# Patient Record
Sex: Male | Born: 1965 | Race: Black or African American | Hispanic: No | Marital: Single | Smoking: Current every day smoker
Health system: Southern US, Community
[De-identification: ages and names within clinical notes are randomized; demographics above are authoritative.]

## PROBLEM LIST (undated history)

## (undated) DIAGNOSIS — E785 Hyperlipidemia, unspecified: Secondary | ICD-10-CM

## (undated) DIAGNOSIS — G43909 Migraine, unspecified, not intractable, without status migrainosus: Secondary | ICD-10-CM

## (undated) DIAGNOSIS — K219 Gastro-esophageal reflux disease without esophagitis: Secondary | ICD-10-CM

## (undated) DIAGNOSIS — F1021 Alcohol dependence, in remission: Secondary | ICD-10-CM

## (undated) DIAGNOSIS — I10 Essential (primary) hypertension: Secondary | ICD-10-CM

## (undated) DIAGNOSIS — R0609 Other forms of dyspnea: Secondary | ICD-10-CM

## (undated) DIAGNOSIS — R0600 Dyspnea, unspecified: Secondary | ICD-10-CM

## (undated) DIAGNOSIS — J449 Chronic obstructive pulmonary disease, unspecified: Secondary | ICD-10-CM

## (undated) DIAGNOSIS — Z8619 Personal history of other infectious and parasitic diseases: Secondary | ICD-10-CM

## (undated) DIAGNOSIS — Z972 Presence of dental prosthetic device (complete) (partial): Secondary | ICD-10-CM

## (undated) DIAGNOSIS — Z87820 Personal history of traumatic brain injury: Secondary | ICD-10-CM

## (undated) DIAGNOSIS — J410 Simple chronic bronchitis: Secondary | ICD-10-CM

## (undated) DIAGNOSIS — N478 Other disorders of prepuce: Secondary | ICD-10-CM

## (undated) HISTORY — DX: Hyperlipidemia, unspecified: E78.5

## (undated) HISTORY — DX: Migraine, unspecified, not intractable, without status migrainosus: G43.909

## (undated) HISTORY — DX: Gastro-esophageal reflux disease without esophagitis: K21.9

---

## 1998-03-05 ENCOUNTER — Emergency Department (HOSPITAL_COMMUNITY): Admission: EM | Admit: 1998-03-05 | Discharge: 1998-03-05 | Payer: Self-pay | Admitting: Emergency Medicine

## 2006-05-19 ENCOUNTER — Ambulatory Visit: Payer: Self-pay | Admitting: Nurse Practitioner

## 2007-01-20 ENCOUNTER — Ambulatory Visit: Payer: Self-pay | Admitting: Internal Medicine

## 2007-09-21 ENCOUNTER — Ambulatory Visit: Payer: Self-pay | Admitting: Family Medicine

## 2007-09-21 ENCOUNTER — Ambulatory Visit: Payer: Self-pay | Admitting: *Deleted

## 2007-10-05 ENCOUNTER — Ambulatory Visit: Payer: Self-pay | Admitting: Internal Medicine

## 2008-02-29 ENCOUNTER — Ambulatory Visit: Payer: Self-pay | Admitting: Internal Medicine

## 2008-02-29 LAB — CONVERTED CEMR LAB
ALT: 36 units/L (ref 0–53)
AST: 22 units/L (ref 0–37)
Albumin: 4.3 g/dL (ref 3.5–5.2)
Alkaline Phosphatase: 59 units/L (ref 39–117)
BUN: 9 mg/dL (ref 6–23)
CO2: 23 meq/L (ref 19–32)
Calcium: 9.4 mg/dL (ref 8.4–10.5)
Chloride: 101 meq/L (ref 96–112)
Cholesterol: 173 mg/dL (ref 0–200)
Creatinine, Ser: 0.93 mg/dL (ref 0.40–1.50)
Glucose, Bld: 65 mg/dL — ABNORMAL LOW (ref 70–99)
HDL: 50 mg/dL (ref >39)
LDL Cholesterol: 102 mg/dL — ABNORMAL HIGH (ref 0–99)
Potassium: 4.2 meq/L (ref 3.5–5.3)
Sodium: 142 meq/L (ref 135–145)
Total Bilirubin: 0.3 mg/dL (ref 0.3–1.2)
Total CHOL/HDL Ratio: 3.5
Total Protein: 7.5 g/dL (ref 6.0–8.3)
Triglycerides: 107 mg/dL (ref <150)
VLDL: 21 mg/dL (ref 0–40)

## 2008-04-05 ENCOUNTER — Ambulatory Visit: Payer: Self-pay | Admitting: Internal Medicine

## 2008-04-06 ENCOUNTER — Ambulatory Visit (HOSPITAL_COMMUNITY): Admission: RE | Admit: 2008-04-06 | Discharge: 2008-04-06 | Payer: Self-pay | Admitting: Family Medicine

## 2008-08-10 ENCOUNTER — Encounter (INDEPENDENT_AMBULATORY_CARE_PROVIDER_SITE_OTHER): Payer: Self-pay | Admitting: Internal Medicine

## 2008-08-10 ENCOUNTER — Ambulatory Visit: Payer: Self-pay | Admitting: Internal Medicine

## 2008-08-10 LAB — CONVERTED CEMR LAB
ALT: 23 units/L (ref 0–53)
AST: 16 units/L (ref 0–37)
Albumin: 4.5 g/dL (ref 3.5–5.2)
Alkaline Phosphatase: 67 units/L (ref 39–117)
Bilirubin, Direct: 0.1 mg/dL (ref 0.0–0.3)
HCV Ab: REACTIVE — AB
Hep A Total Ab: NEGATIVE
Hep B Core Total Ab: POSITIVE — AB
Hep B E Ab: POSITIVE — AB
Hep B S Ab: NEGATIVE
Indirect Bilirubin: 0.2 mg/dL (ref 0.0–0.9)
Total Bilirubin: 0.3 mg/dL (ref 0.3–1.2)
Total Protein: 7.5 g/dL (ref 6.0–8.3)

## 2008-08-31 ENCOUNTER — Ambulatory Visit: Payer: Self-pay | Admitting: Internal Medicine

## 2009-01-03 ENCOUNTER — Ambulatory Visit: Payer: Self-pay | Admitting: Internal Medicine

## 2009-01-31 ENCOUNTER — Ambulatory Visit: Payer: Self-pay | Admitting: Gastroenterology

## 2009-02-18 ENCOUNTER — Ambulatory Visit: Payer: Self-pay | Admitting: Internal Medicine

## 2009-02-21 ENCOUNTER — Ambulatory Visit (HOSPITAL_COMMUNITY): Admission: RE | Admit: 2009-02-21 | Discharge: 2009-02-21 | Payer: Self-pay | Admitting: Gastroenterology

## 2009-02-21 ENCOUNTER — Encounter (INDEPENDENT_AMBULATORY_CARE_PROVIDER_SITE_OTHER): Payer: Self-pay | Admitting: Interventional Radiology

## 2009-03-28 ENCOUNTER — Ambulatory Visit: Payer: Self-pay | Admitting: Gastroenterology

## 2009-11-20 ENCOUNTER — Ambulatory Visit: Payer: Self-pay | Admitting: Internal Medicine

## 2009-11-20 LAB — CONVERTED CEMR LAB
Chlamydia, Swab/Urine, PCR: NEGATIVE
GC Probe Amp, Urine: NEGATIVE

## 2010-03-27 ENCOUNTER — Ambulatory Visit: Payer: Self-pay | Admitting: Internal Medicine

## 2010-04-24 ENCOUNTER — Ambulatory Visit: Payer: Self-pay | Admitting: Gastroenterology

## 2010-08-31 ENCOUNTER — Emergency Department (HOSPITAL_COMMUNITY)
Admission: EM | Admit: 2010-08-31 | Discharge: 2010-08-31 | Payer: Self-pay | Source: Home / Self Care | Admitting: Emergency Medicine

## 2010-10-10 ENCOUNTER — Emergency Department (HOSPITAL_COMMUNITY)
Admission: EM | Admit: 2010-10-10 | Discharge: 2010-10-10 | Disposition: A | Discharge status: Home / Self Care | Payer: Self-pay | Attending: Emergency Medicine | Admitting: Emergency Medicine

## 2010-10-10 DIAGNOSIS — L299 Pruritus, unspecified: Secondary | ICD-10-CM | POA: Insufficient documentation

## 2010-10-10 DIAGNOSIS — R21 Rash and other nonspecific skin eruption: Secondary | ICD-10-CM | POA: Insufficient documentation

## 2010-11-09 LAB — CBC
HCT: 46.4 % (ref 39.0–52.0)
Hemoglobin: 15.4 g/dL (ref 13.0–17.0)
MCHC: 33.1 g/dL (ref 30.0–36.0)
MCV: 85.5 fL (ref 78.0–100.0)
Platelets: 229 10*3/uL (ref 150–400)
RBC: 5.43 MIL/uL (ref 4.22–5.81)
RDW: 14 % (ref 11.5–15.5)
WBC: 7.4 10*3/uL (ref 4.0–10.5)

## 2010-11-09 LAB — APTT: aPTT: 25 seconds (ref 24–37)

## 2010-11-09 LAB — PROTIME-INR
INR: 0.9 (ref 0.00–1.49)
Prothrombin Time: 11.9 seconds (ref 11.6–15.2)

## 2010-11-15 ENCOUNTER — Emergency Department (HOSPITAL_COMMUNITY)
Admission: EM | Admit: 2010-11-15 | Discharge: 2010-11-15 | Disposition: A | Discharge status: Home / Self Care | Payer: Self-pay | Attending: Emergency Medicine | Admitting: Emergency Medicine

## 2010-11-15 ENCOUNTER — Emergency Department (HOSPITAL_COMMUNITY): Payer: Self-pay

## 2010-11-15 DIAGNOSIS — R040 Epistaxis: Secondary | ICD-10-CM | POA: Insufficient documentation

## 2010-11-15 DIAGNOSIS — L851 Acquired keratosis [keratoderma] palmaris et plantaris: Secondary | ICD-10-CM | POA: Insufficient documentation

## 2010-11-15 DIAGNOSIS — L299 Pruritus, unspecified: Secondary | ICD-10-CM | POA: Insufficient documentation

## 2010-11-15 DIAGNOSIS — R221 Localized swelling, mass and lump, neck: Secondary | ICD-10-CM | POA: Insufficient documentation

## 2010-11-15 DIAGNOSIS — L259 Unspecified contact dermatitis, unspecified cause: Secondary | ICD-10-CM | POA: Insufficient documentation

## 2010-11-15 DIAGNOSIS — Z8619 Personal history of other infectious and parasitic diseases: Secondary | ICD-10-CM | POA: Insufficient documentation

## 2010-11-15 DIAGNOSIS — R220 Localized swelling, mass and lump, head: Secondary | ICD-10-CM | POA: Insufficient documentation

## 2010-11-15 DIAGNOSIS — J309 Allergic rhinitis, unspecified: Secondary | ICD-10-CM | POA: Insufficient documentation

## 2010-11-15 LAB — COMPREHENSIVE METABOLIC PANEL
ALT: 38 U/L (ref 0–53)
AST: 29 U/L (ref 0–37)
Albumin: 3.6 g/dL (ref 3.5–5.2)
Alkaline Phosphatase: 56 U/L (ref 39–117)
BUN: 11 mg/dL (ref 6–23)
CO2: 27 mEq/L (ref 19–32)
Calcium: 9.3 mg/dL (ref 8.4–10.5)
Chloride: 108 mEq/L (ref 96–112)
Creatinine, Ser: 1.17 mg/dL (ref 0.4–1.5)
GFR calc Af Amer: >60 mL/min (ref >60)
GFR calc non Af Amer: >60 mL/min (ref >60)
Glucose, Bld: 104 mg/dL — ABNORMAL HIGH (ref 70–99)
Potassium: 4.1 mEq/L (ref 3.5–5.1)
Sodium: 141 mEq/L (ref 135–145)
Total Bilirubin: 0.4 mg/dL (ref 0.3–1.2)
Total Protein: 6.7 g/dL (ref 6.0–8.3)

## 2010-11-15 LAB — CBC
HCT: 45.4 % (ref 39.0–52.0)
Hemoglobin: 15.6 g/dL (ref 13.0–17.0)
MCH: 28.7 pg (ref 26.0–34.0)
MCHC: 34.4 g/dL (ref 30.0–36.0)
MCV: 83.6 fL (ref 78.0–100.0)
Platelets: 244 10*3/uL (ref 150–400)
RBC: 5.43 MIL/uL (ref 4.22–5.81)
RDW: 13.8 % (ref 11.5–15.5)
WBC: 8.3 10*3/uL (ref 4.0–10.5)

## 2010-11-15 LAB — DIFFERENTIAL
Basophils Absolute: 0.1 10*3/uL (ref 0.0–0.1)
Basophils Relative: 1 % (ref 0–1)
Eosinophils Absolute: 0.9 10*3/uL — ABNORMAL HIGH (ref 0.0–0.7)
Eosinophils Relative: 10 % — ABNORMAL HIGH (ref 0–5)
Lymphocytes Relative: 39 % (ref 12–46)
Lymphs Abs: 3.2 10*3/uL (ref 0.7–4.0)
Monocytes Absolute: 0.5 10*3/uL (ref 0.1–1.0)
Monocytes Relative: 6 % (ref 3–12)
Neutro Abs: 3.7 10*3/uL (ref 1.7–7.7)
Neutrophils Relative %: 44 % (ref 43–77)

## 2010-11-15 LAB — RAPID STREP SCREEN (MED CTR MEBANE ONLY): Streptococcus, Group A Screen (Direct): NEGATIVE

## 2011-03-26 ENCOUNTER — Ambulatory Visit (INDEPENDENT_AMBULATORY_CARE_PROVIDER_SITE_OTHER): Payer: Self-pay | Admitting: Gastroenterology

## 2011-03-26 DIAGNOSIS — B182 Chronic viral hepatitis C: Secondary | ICD-10-CM

## 2011-03-27 LAB — CBC WITH DIFFERENTIAL/PLATELET
Basophils Absolute: 0 10*3/uL (ref 0.0–0.1)
Basophils Relative: 1 % (ref 0–1)
Eosinophils Absolute: 0.5 10*3/uL (ref 0.0–0.7)
Eosinophils Relative: 8 % — ABNORMAL HIGH (ref 0–5)
HCT: 46.8 % (ref 39.0–52.0)
Hemoglobin: 15.7 g/dL (ref 13.0–17.0)
Lymphocytes Relative: 51 % — ABNORMAL HIGH (ref 12–46)
Lymphs Abs: 3.4 10*3/uL (ref 0.7–4.0)
MCH: 28.1 pg (ref 26.0–34.0)
MCHC: 33.5 g/dL (ref 30.0–36.0)
MCV: 83.7 fL (ref 78.0–100.0)
Monocytes Absolute: 0.4 10*3/uL (ref 0.1–1.0)
Monocytes Relative: 6 % (ref 3–12)
Neutro Abs: 2.3 10*3/uL (ref 1.7–7.7)
Neutrophils Relative %: 35 % — ABNORMAL LOW (ref 43–77)
Platelets: 273 10*3/uL (ref 150–400)
RBC: 5.59 MIL/uL (ref 4.22–5.81)
RDW: 13.9 % (ref 11.5–15.5)
WBC: 6.7 10*3/uL (ref 4.0–10.5)

## 2011-03-27 LAB — CREATININE, SERUM: Creat: 0.99 mg/dL (ref 0.50–1.35)

## 2011-03-27 LAB — HEPATIC FUNCTION PANEL
ALT: 32 U/L (ref 0–53)
AST: 23 U/L (ref 0–37)
Albumin: 4.7 g/dL (ref 3.5–5.2)
Alkaline Phosphatase: 56 U/L (ref 39–117)
Bilirubin, Direct: 0.1 mg/dL (ref 0.0–0.3)
Indirect Bilirubin: 0.3 mg/dL (ref 0.0–0.9)
Total Bilirubin: 0.4 mg/dL (ref 0.3–1.2)
Total Protein: 7.8 g/dL (ref 6.0–8.3)

## 2011-03-27 LAB — PROTIME-INR
INR: 0.87 (ref <1.50)
Prothrombin Time: 12.2 seconds (ref 11.6–15.2)

## 2011-04-09 NOTE — Progress Notes (Signed)
Referring physician:  Georganna Skeans, MD [facility] [address] [place], [place]   [address] Fax:  [phone]   REASON FOR VISIT:  Follow up of genotype 1A hepatitis C.    HISTORY:  The patient returns today unaccompanied. He had a biopsy on 02/21/2009, showing grade 1 stage zero disease. His IL28-B was CT. When last seen on 04/24/2010, we discussed treatment.  He was supposed  to be reviewed to see if he was a candidate for any protocols. If he was reviewed, he was not deemed to be a candidate. He was also supposed to be transferred for treatment to [facility]. However, this  apparently was never done.  He currently is symptomatic.  There are no symptoms to suggest cryoglobulin mediated or decompensated liver disease.   PAST MEDICAL HISTORY:  There has been no other interval change.   CURRENT MEDICATIONS:  None.   ALLERGIES:  None.   Smoking:  Quit on 01/15/2011.    Alcohol:  Denies interval consumption.   REVIEW OF SYSTEMS:  All 10 systems reviewed today with the patient and are negative other than that which is mentioned above. His CES-D was 8.    PHYSICAL EXAMINATION:  Constitutional:  He is well-appearing. Vital signs Height 68 inches, weight 200 pounds.  Blood pressure 143/108, pulse 76, temperature 98.3.  Ears, nose, mouth and throat:  Unremarkable oropharynx.  No  thyromegaly or neck masses.  Chest:  Resonant to percussion.  Clear to auscultation.  Cardiovascular:  Heart sounds normal S1, S2 without murmurs or rubs.  There is no peripheral edema.  Abdominal:  Normal  bowel sounds.  No masses or tenderness.  I could not appreciate a liver edge or spleen tip.  I could not appreciate any hernias.  Lymphatics:  No cervical or inguinal lymphadenopathy.  Central Nervous  System:  No asterixis or focal neurologic findings.  Dermatologic:  Anicteric without palmar erythema or spider angiomata.  Eyes:  Anicteric sclerae.  Pupils are equal and reactive to light.    Laboratory data:  On 11/15/10 his albumin was 3.6, AST 29, ALT 38, ALT 56, total bilirubin 0.4.  His creatinine was 1.17. CBC showed hemoglobin was 15.6, white count 8.3, platelet count 244.   ASSESSMENT:  The patient is a 45 year old gentleman with a history of genotype 1A hepatitis C, IL28-B CT genotype.  Liver biopsy on 02/22/2009, showing grade 1 stage zero disease. He is naive to therapy.  At this time there is really no rush to treat him, considering how benign his disease was on his previous liver biopsy and his laboratory tests are. In fact, if he waits he may be able to take advantage of at  least the once-daily protease inhibitor, simeprevir, which may be available within the next 2 years and which has the advantage of a once daily dosing as opposed to current t.i.d. dosing for protease  inhibitors. Furthermore, he may wish to wait for the development of other, perhaps even non-interferon based, protocols.   DISCUSSION:  In my discussion today with the patient, we discussed the option of starting him on therapy now. However, I told him that considering his previous lab tests and biopsies, he has the luxury of waiting to be  started on therapy at some point where perhaps there are better medications to use. He was interested in doing so.   PLAN:  1. Standard labs today including liver enzymes, INR, creatinine, CBC with differential. 2. He requires hepatitis A vaccination, which I suggest be  done through [facility].  His previous serologies to me suggest that he was hepatitis B immune.  3. He is to return in 1 year's time.            Brooke Dare, MD  ADDENDUM:  Labs acceptable.

## 2011-04-21 ENCOUNTER — Ambulatory Visit (INDEPENDENT_AMBULATORY_CARE_PROVIDER_SITE_OTHER): Payer: Self-pay | Admitting: Family Medicine

## 2011-04-21 DIAGNOSIS — F1021 Alcohol dependence, in remission: Secondary | ICD-10-CM

## 2011-04-21 DIAGNOSIS — B182 Chronic viral hepatitis C: Secondary | ICD-10-CM

## 2011-04-21 NOTE — Progress Notes (Signed)
  Subjective:    Patient ID: Connor Oneal, male    DOB: [birth year], 45 y.o.   MRN: [id number]  HPI  Patient presents to clinic to meet new doctor and establish care.  He was seen at [facility] and urgent care centers for the last 3 years.  He has no concerns/complaints.  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  He went to rehab and continues to go to [employer]  He also says that bible study has helped him, also.    Patient also has been diagnosed with Hepatitis C.  Currently, asymptomatic.  Not taking any medications.  Past medical, surgical, family history, and allergies were all reviewed.   Review of Systems  Denies any recent ED/hospital visits.  Denies any fevers, chills, NS, chest pain, abdominal pain, nausea/vomiting.    Objective:   Physical Exam  Constitutional: No distress.  HENT:  Head: Atraumatic. Macrocephalic.       Mildly icteric  Neck: Normal range of motion. Neck supple.  Cardiovascular: Normal rate and regular rhythm.  Exam reveals no gallop and no friction rub.   No murmur heard. Pulmonary/Chest: Effort normal and breath sounds normal. He has no wheezes. He has no rales.  Abdominal: Soft. Bowel sounds are normal. He exhibits no distension and no mass. There is no tenderness.  Lymphadenopathy:    He has no cervical adenopathy.           Assessment & Plan:

## 2011-04-22 ENCOUNTER — Encounter: Payer: Self-pay | Admitting: Family Medicine

## 2011-04-22 DIAGNOSIS — Z8619 Personal history of other infectious and parasitic diseases: Secondary | ICD-10-CM | POA: Insufficient documentation

## 2011-04-22 DIAGNOSIS — F1021 Alcohol dependence, in remission: Secondary | ICD-10-CM | POA: Insufficient documentation

## 2011-04-22 NOTE — Assessment & Plan Note (Signed)
May consider repeat Hepatitis panel at next visit. Consider referral to liver specialist/Hepatitis clinic.

## 2011-04-22 NOTE — Assessment & Plan Note (Signed)
Encouraged patient to continue going to AA rehab and Bible study. Patient to return to clinic for annual physical at his earliest convenience. May draw CBC, CMET, TSH at that time.

## 2011-04-30 ENCOUNTER — Telehealth: Payer: Self-pay | Admitting: Family Medicine

## 2011-04-30 ENCOUNTER — Telehealth: Payer: Self-pay | Admitting: *Deleted

## 2011-04-30 NOTE — Telephone Encounter (Signed)
Patient brought [place] his [facility] card and needs a referral to the dental clinic.

## 2011-04-30 NOTE — Telephone Encounter (Signed)
Called and informed pt that I looked back at his last visit and there was nothing mentioned about a dental referral. I told him that since there was no mention of this there may be a possibility he will be required to come back into the office to discuss this.  He stated that he did speak with Dr. Tye Savoy about this and she told him that he will have to get everything in place with debra [facility] and she would go from there.  I also told patient that once the referral has been made that he will be placed on a waiting list and they only take 10 patients per month, and I was unsure if that was 10 patients from our office or 10 from all over [place]. Pt understood and agreed.[name]

## 2011-05-01 NOTE — Telephone Encounter (Signed)
Spoke with patient and when he comes in on 10/10 on his f/u visit, we will discuss the dental referral and get that going

## 2011-05-08 ENCOUNTER — Other Ambulatory Visit: Payer: Self-pay | Admitting: Family Medicine

## 2011-05-08 DIAGNOSIS — Z0000 Encounter for general adult medical examination without abnormal findings: Secondary | ICD-10-CM

## 2011-05-14 ENCOUNTER — Ambulatory Visit (INDEPENDENT_AMBULATORY_CARE_PROVIDER_SITE_OTHER): Payer: Self-pay | Admitting: Family Medicine

## 2011-05-14 VITALS — BP 132/88 | Temp 98.2°F | Ht 68.0 in | Wt 203.0 lb

## 2011-05-14 DIAGNOSIS — R5383 Other fatigue: Secondary | ICD-10-CM

## 2011-05-14 DIAGNOSIS — K029 Dental caries, unspecified: Secondary | ICD-10-CM | POA: Insufficient documentation

## 2011-05-14 DIAGNOSIS — Z23 Encounter for immunization: Secondary | ICD-10-CM

## 2011-05-14 DIAGNOSIS — R5381 Other malaise: Secondary | ICD-10-CM

## 2011-05-14 LAB — TSH: TSH: 1.332 u[IU]/mL (ref 0.350–4.500)

## 2011-05-14 NOTE — Assessment & Plan Note (Signed)
Likely secondary to poor sleep hygiene.  Advised patient to get 7-9 hours of sleep at night, avoid watching TV late at night, and to avoid napping during the day.  If patient continues to wake up in the middle of the night, recommended OTC sleep aids or Benadryl prn for insomnia.  Will order TSH to rule out hypothyroidism.  Reviewed recent CBC and CMET with patient - no abnormalities.  Return to clinic in 6 months or sooner if symptoms worsen.

## 2011-05-14 NOTE — Assessment & Plan Note (Signed)
This is a chronic issue.  Patient now has [facility] card and dental referral in progress.  According to nursing staff, dentists who accept Connor Oneal accept 10 patients/month.  Discussed this with patient and advised him to call in 2 weeks if he has not heard from [place] about referral.  Advised patient to return to clinic if develops dental or gum pain, fever, or signs of infection/abscess.  Patient understood/agreed with plan.  Follow up in 6 months or prn if symptoms worsen.

## 2011-05-14 NOTE — Patient Instructions (Signed)
It was nice to see you again. Please try to get plenty of rest at night - take OTC benadryl as needed for nights when you cannot sleep. We will call or send you a letter with lab results. You are on the waiting list to see a dentist.  Call us in 2 weeks to see where you are on that list. You can follow up with me in 6 months or sooner as needed. Thank you  Dental Caries  Dental caries (tooth decay) is the most common of all oral diseases. It is particularly important to deal with it from your child's 1st to 12th year of life. In these years, the baby teeth erupt and are replaced by the permanent teeth. The permanent teeth, excluding the 3rd molars, erupt (come out) into their working position. For early treatment of dental caries, your child should get their first dental checkup between one and one-half and two years of age. This is important before any extensive cavities are established. Dental caries often appears as a white chalky area on the enamel. It later softens, and then the tooth structure breaks down. If not treated right away, it progresses towards the pulp. It will then require more extensive treatment to save the tooth. PREVENTION  In children, dentistry is mainly used for the prevention of dental cavities.   Sealants can help with prevention of cavities. Sealants are composite resins applied onto surfaces of teeth at risk for decay. They smooth out the fissures and pits, and prevent food entrapment that causes decay.   Fluoride tablets may also be prescribed in the children over 81 years of age, if your drinking water is not fluoridated. The fluoride absorbed by the tooth enamel makes them less susceptible to caries.   Thorough daily cleaning with a toothbrush and dental floss is the best way to prevent cavities.   Regular visits with a dentist for check-ups and cleanings is also very important.  SEEK IMMEDIATE MEDICAL ATTENTION IF:  You develop a fever over 102 F (38 C).    You develop redness and swelling of your face, jaw, or neck.   You are unable to open your mouth.   You have severe pain uncontrolled by pain medicine.  Document Released: 04/11/2002 Document Re-Released: 10/14/2009 [facility] Patient Information 2011 [place], [place].

## 2011-05-14 NOTE — Progress Notes (Signed)
  Subjective:    Patient ID: Connor Oneal, male    DOB: 12-26-1965, 45 y.o.   MRN: [id number]  HPI  Patient presents to clinic to discuss dental caries and fatigue.  Dental caries: Patient has been waiting for his [facility] card so he can set up a dentist appointment.  He has not seen a dentist in awhile and does not take care of his teeth.  For a few months, he has noticed his teeth turning brown/black and bleeding gums (every am when he brushes teeth).  He denies any dental pain or gingivitis.  He denies any fever, chills, night sweats.  He does endorse sensitivity to cold and hot foods.   Fatigue: Patient has been feeling tired for the last 3 months.  He does not sleep well at night, wakes up several times for no particular reason.  He does not nap during the day, but does watch TV late at night before bed.  He has been told that he snores at night.  Denies dizziness, weakness, syncopal episodes.  Reviewed past medical history, allergies, problem list, and medications.  Review of Systems  Per HPI  SIGECAPS: positive for difficulty sleeping, loss of energy, difficulty concentrating only.    Objective:   Physical Exam  Constitutional: No distress.  HENT:  Head: Macrocephalic. No trismus in the jaw.  Mouth/Throat: Uvula is midline and oropharynx is clear and moist. He does not have dentures. No oral lesions. Abnormal dentition. Dental caries present. No dental abscesses, uvula swelling or lacerations.  Neck: Neck supple.  Cardiovascular: Normal rate and regular rhythm.   Pulmonary/Chest: Breath sounds normal. He has no wheezes. He has no rales.  Lymphadenopathy:    He has no cervical adenopathy.  Psychiatric: He has a normal mood and affect. His speech is normal and behavior is normal. He expresses no homicidal and no suicidal ideation. He expresses no suicidal plans and no homicidal plans.          Assessment & Plan:

## 2011-06-03 ENCOUNTER — Emergency Department (HOSPITAL_COMMUNITY)
Admission: EM | Admit: 2011-06-03 | Discharge: 2011-06-03 | Disposition: A | Discharge status: Home / Self Care | Payer: Self-pay | Attending: Emergency Medicine | Admitting: Emergency Medicine

## 2011-06-03 DIAGNOSIS — M545 Low back pain, unspecified: Secondary | ICD-10-CM | POA: Insufficient documentation

## 2011-06-03 DIAGNOSIS — G8929 Other chronic pain: Secondary | ICD-10-CM | POA: Insufficient documentation

## 2011-06-03 DIAGNOSIS — Z8619 Personal history of other infectious and parasitic diseases: Secondary | ICD-10-CM | POA: Insufficient documentation

## 2011-06-03 DIAGNOSIS — M549 Dorsalgia, unspecified: Secondary | ICD-10-CM | POA: Insufficient documentation

## 2011-07-23 ENCOUNTER — Encounter (HOSPITAL_COMMUNITY): Payer: Self-pay | Admitting: Emergency Medicine

## 2011-07-23 ENCOUNTER — Emergency Department (HOSPITAL_COMMUNITY)
Admission: EM | Admit: 2011-07-23 | Discharge: 2011-07-24 | Disposition: A | Discharge status: Home / Self Care | Payer: Self-pay | Attending: Emergency Medicine | Admitting: Emergency Medicine

## 2011-07-23 DIAGNOSIS — Z8619 Personal history of other infectious and parasitic diseases: Secondary | ICD-10-CM | POA: Insufficient documentation

## 2011-07-23 DIAGNOSIS — F17200 Nicotine dependence, unspecified, uncomplicated: Secondary | ICD-10-CM | POA: Insufficient documentation

## 2011-07-23 DIAGNOSIS — R05 Cough: Secondary | ICD-10-CM

## 2011-07-23 DIAGNOSIS — R062 Wheezing: Secondary | ICD-10-CM | POA: Insufficient documentation

## 2011-07-23 DIAGNOSIS — R059 Cough, unspecified: Secondary | ICD-10-CM | POA: Insufficient documentation

## 2011-07-23 MED ORDER — PREDNISONE 20 MG PO TABS: 40.0000 mg | ORAL_TABLET | Freq: Every day | ORAL | Status: DC

## 2011-07-23 MED ORDER — OPTICHAMBER ADVANTAGE MISC
1.0000 | Freq: Once | Status: AC
  Administered 2011-07-24: 1

## 2011-07-23 MED ORDER — ALBUTEROL SULFATE HFA 108 (90 BASE) MCG/ACT IN AERS
2.0000 | INHALATION_SPRAY | RESPIRATORY_TRACT | Status: DC | PRN
  Administered 2011-07-24: 2 via RESPIRATORY_TRACT
  Filled 2011-07-23: qty 6.7

## 2011-07-23 NOTE — ED Notes (Signed)
Had a sore throat and cough about 2 weeks ago. Sore throat went away but cough did not.

## 2011-07-24 MED ORDER — PREDNISONE 20 MG PO TABS: 40.0000 mg | ORAL_TABLET | Freq: Every day | ORAL | Status: AC

## 2011-07-24 NOTE — ED Provider Notes (Signed)
Medical screening examination/treatment/procedure(s) were performed by non-physician practitioner and as supervising physician I was immediately available for consultation/collaboration.   Joya Gaskins, MD 07/24/11 [phone]

## 2011-07-24 NOTE — ED Provider Notes (Signed)
History     CSN: [id number]  Arrival date & time 07/23/11  2257   First MD Initiated Contact with Patient 07/23/11 2338      Chief Complaint  Patient presents with  . Cough    Had a sore throat and about 2 week ago. Sore throat went away but cought did not.    (Consider location/radiation/quality/duration/timing/severity/associated sxs/prior treatment) HPI Comments: Patient with onset of cough during a viral upper respiratory illness 2 weeks ago. Patient states he had sore throat and runny nose at that time. Those symptoms have resolved however cough remains. Cough is sometimes productive. He also states that he has wheezing at times. No treatments prior. Patient states cough is keeping him up at night. He has a residual sore throat due to coughing.  Patient is a 45 y.o. male presenting with cough. The history is provided by the patient.  Cough This is a new problem. The current episode started more than 1 week ago. The problem occurs constantly. The problem has not changed since onset.The cough is productive of sputum. There has been no fever. Associated symptoms include sore throat and wheezing. Pertinent negatives include no chest pain, no chills, no ear congestion, no headaches, no rhinorrhea, no myalgias, no shortness of breath and no eye redness. He is a smoker. His past medical history is significant for COPD.    Past Medical History  Diagnosis Date  . Hepatitis c, chronic   . Irregular heart beat     Past Surgical History  Procedure Date  . Hand surgery     Family History  Problem Relation Age of Onset  . COPD Mother   . Alcohol abuse Father   . COPD Father     History  Substance Use Topics  . Smoking status: Current Some Day Smoker -- 1.0 packs/day  . Smokeless tobacco: Never Used  . 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  HENT: Positive for sore  throat. Negative for rhinorrhea.   Eyes: Negative for redness.  Respiratory: Positive for cough and wheezing. Negative for shortness of breath.   Cardiovascular: Negative for chest pain.  Gastrointestinal: Negative for nausea, vomiting, abdominal pain, diarrhea and constipation.  Genitourinary: Negative for dysuria.  Musculoskeletal: Negative for myalgias.  Skin: Negative for rash.  Neurological: Negative for dizziness and headaches.    Allergies  Review of patient's allergies indicates no known allergies.  Home Medications   Current Outpatient Rx  Name Route Sig Dispense Refill  . PREDNISONE 20 MG PO TABS Oral Take 2 tablets (40 mg total) by mouth daily. 10 tablet 0    BP 129/83  Pulse 99  Temp(Src) 98.4 F (36.9 C) (Oral)  Resp 20  SpO2 97%  Physical Exam  Nursing note and vitals reviewed. Constitutional: He is oriented to person, place, and time. He appears well-developed and well-nourished.  HENT:  Head: Normocephalic and atraumatic.  Eyes: Conjunctivae are normal. Right eye exhibits no discharge. Left eye exhibits no discharge.  Neck: Normal range of motion. Neck supple.  Cardiovascular: Normal rate, regular rhythm and normal heart sounds.   Pulmonary/Chest: Effort normal and breath sounds normal. No respiratory distress. He has no wheezes.  Abdominal: Soft. Bowel sounds are normal. There is no tenderness. There is no rebound and no guarding.  Musculoskeletal: He exhibits no edema.  Neurological: He is alert and oriented to person,  place, and time.  Skin: Skin is warm and dry.  Psychiatric: He has a normal mood and affect.    ED Course  Procedures (including critical care time)  Labs Reviewed - No data to display No results found.   1. Cough    12:05 AM patient seen and examined. Patient refuses cough syrup. Will give albuterol inhaler as well as prednisone burst. Patient urged to return with fever, worsening shortness of breath, other concerns. Urged to  followup with his primary care physician in one week.   MDM  Patient with a lingering cough after recent URI. Do not suspect significant COPD exacerbation. Do not suspect pneumonia. Patient appears well, is in no respiratory distress. Patient has normal vital signs and 97% oxygen saturation on room air. He appears well and is stable for discharge home.        Eustace Moore [place], [place] 07/24/11 [phone]

## 2011-08-10 ENCOUNTER — Encounter: Payer: Self-pay | Admitting: Family Medicine

## 2011-08-10 ENCOUNTER — Ambulatory Visit (INDEPENDENT_AMBULATORY_CARE_PROVIDER_SITE_OTHER): Payer: Self-pay | Admitting: Family Medicine

## 2011-08-10 ENCOUNTER — Telehealth: Payer: Self-pay | Admitting: *Deleted

## 2011-08-10 DIAGNOSIS — K219 Gastro-esophageal reflux disease without esophagitis: Secondary | ICD-10-CM

## 2011-08-10 DIAGNOSIS — B356 Tinea cruris: Secondary | ICD-10-CM

## 2011-08-10 HISTORY — DX: Gastro-esophageal reflux disease without esophagitis: K21.9

## 2011-08-10 MED ORDER — TRIAMCINOLONE ACETONIDE 0.1 % EX OINT: TOPICAL_OINTMENT | Freq: Two times a day (BID) | CUTANEOUS | Status: AC

## 2011-08-10 MED ORDER — FAMOTIDINE 20 MG PO TABS: 20.0000 mg | ORAL_TABLET | Freq: Two times a day (BID) | ORAL | Status: DC

## 2011-08-10 MED ORDER — CLOTRIMAZOLE 1 % EX CREA: TOPICAL_CREAM | Freq: Two times a day (BID) | CUTANEOUS | Status: AC

## 2011-08-10 MED ORDER — RANITIDINE HCL 150 MG PO TABS: 150.0000 mg | ORAL_TABLET | Freq: Two times a day (BID) | ORAL | Status: DC

## 2011-08-10 NOTE — Telephone Encounter (Signed)
[facility] because they do not carry Zantac 150 mg but they do have Generic Pepcid 20 mg.  Ok to change per Dr. Tye Savoy.  Pharmacy notified.  Ileana Ladd

## 2011-08-10 NOTE — Progress Notes (Signed)
  Subjective:    Patient ID: Connor Oneal, male    DOB: [birth year], 46 y.o.   MRN: [id number]  HPI  Patient presents to clinic with dry cough and rash on groin.  Dry cough: started one month ago.  Only cough when he wakes up in am and when he lays down to sleep in PM.  Patient continues to smoke 1 PPD.  Cough is dry.  Patient says when he coughs, it "feels like something is stuck in his chest and throat."  No aggregating factors.  Patient was seen in ED for cough and told he had bronchitis, he was given Rx for steroids and albuterol inhaler but patient could not afford medications.  Denies any fever, chills, NS, N/V.  Denies chest pain, abdominal pain.  Groin rash:  Started 2-3 weeks ago.  Describes it as itchy and painful when he scratches it.  Located on bilateral inner thighs and left scrotum.  Patient says he has had "jock itch" in the past.  Denies any urinary symptoms, penile discharge, dysuria.     Review of Systems  Per HPI    Objective:   Physical Exam  Constitutional: No distress.  HENT:  Mouth/Throat: Oropharynx is clear and moist.  Cardiovascular: Normal rate and regular rhythm.  Exam reveals no gallop and no friction rub.   No murmur heard. Pulmonary/Chest: Effort normal and breath sounds normal. He has no wheezes. He has no rales.  Genitourinary: Testes normal. Right testis shows no swelling and no tenderness. Left testis shows no swelling and no tenderness. Penile erythema present. No penile tenderness. No discharge found.  Skin:       Dry, red, scaly lesions located on bilateral inner thighs and left scrotum has few, round, red and ulcerated lesions.            Assessment & Plan:

## 2011-08-10 NOTE — Patient Instructions (Signed)
Follow up in 4-6 weeks to recheck cough and rash. Call MAP for medication cost assistance, then bring Rx to pharmacy.  Jock Itch Jock itch is a fungal infection of the skin in the groin area. It is sometimes called "ringworm" even though it is not caused by a worm. A fungus is a type of germ that thrives in dark, damp places.  CAUSES  This infection may spread from:  A fungus infection elsewhere on the body (such as athlete's foot)   Sharing towels, clothing, etc.  This infection is more common in:  Hot, humid climates.   People who wear tight-fitting clothing or wet bathing suits for long periods of time.   Athletes.   Overweight people.   People with diabetes.  SYMPTOMS  Jock itch causes the following symptoms:  Red, pink or brown rash in the groin. Rash may spread to the thighs, anus and buttocks.   Itching  DIAGNOSIS  Your caregiver may make the diagnosis by looking at the rash. Sometimes a skin scraping will be sent to test for fungus. Testing can be done either by looking under the microscope or by doing a culture (test to try to grow the fungus). A culture can take up to 2 weeks to come back. TREATMENT  Jock itch may be treated with:  Skin cream or ointment to kill fungus.   Medicine by mouth to kill fungus.   Skin cream or ointment to calm the itching.   Compresses or medicated powders to dry the infected skin.  HOME CARE INSTRUCTIONS   Be sure to treat the rash completely. Follow your caregiver's instructions. It can take a couple of weeks to treat. If you do not treat the infection long enough, the rash can come back.   Wear loose fitting clothing.   Men should wear cotton boxer shorts   Women should wear cotton underwear.   Avoid hot baths.   Dry the groin area well after bathing.  SEEK MEDICAL CARE IF:   Your rash is worse.   Your rash is spreading.   Your rash returns after treatment is finished.   Your rash is not gone in 4 weeks. Fungal  infections are slow to respond to treatment. Some redness may remain for several weeks after the fungus is gone.  SEEK IMMEDIATE MEDICAL CARE IF:  The area becomes red, warm, tender, and swollen.   You have a fever.  Document Released: 07/10/2002 Document Revised: 04/01/2011 Document Reviewed: 06/08/2008 [facility] Patient Information 2012 [place], [place].

## 2011-08-10 NOTE — Assessment & Plan Note (Signed)
Dry cough likely secondary to GERD.  Start Zantac 150 PO daily and follow up in 1 month.

## 2011-08-10 NOTE — Assessment & Plan Note (Signed)
Start Lotrimin cream BID and recheck in 4 weeks.  Home instructions given.

## 2011-09-08 ENCOUNTER — Ambulatory Visit (HOSPITAL_COMMUNITY)
Admission: RE | Admit: 2011-09-08 | Discharge: 2011-09-08 | Disposition: A | Discharge status: Home / Self Care | Payer: Self-pay | Source: Ambulatory Visit | Attending: Family Medicine | Admitting: Family Medicine

## 2011-09-08 ENCOUNTER — Encounter: Payer: Self-pay | Admitting: Family Medicine

## 2011-09-08 ENCOUNTER — Ambulatory Visit (INDEPENDENT_AMBULATORY_CARE_PROVIDER_SITE_OTHER): Payer: Self-pay | Admitting: Family Medicine

## 2011-09-08 VITALS — BP 140/96 | HR 92 | Temp 97.8°F | Ht 68.0 in | Wt 193.8 lb

## 2011-09-08 DIAGNOSIS — R053 Chronic cough: Secondary | ICD-10-CM

## 2011-09-08 DIAGNOSIS — J449 Chronic obstructive pulmonary disease, unspecified: Secondary | ICD-10-CM | POA: Insufficient documentation

## 2011-09-08 DIAGNOSIS — R059 Cough, unspecified: Secondary | ICD-10-CM

## 2011-09-08 DIAGNOSIS — R05 Cough: Secondary | ICD-10-CM

## 2011-09-08 DIAGNOSIS — B182 Chronic viral hepatitis C: Secondary | ICD-10-CM

## 2011-09-08 MED ORDER — OMEPRAZOLE 40 MG PO CPDR: 40.0000 mg | DELAYED_RELEASE_CAPSULE | Freq: Every day | ORAL | Status: DC

## 2011-09-08 NOTE — Patient Instructions (Signed)
Please purchase OTC Zyrtec or Claritin D. Pick up Prilosec from the [facility]. And take one daily. Go to [facility] for a chest X-ray and call our office in 48 hrs for results. If symptoms worsen after 4 weeks, please return to clinic. If you develop fever, chills, nausea, vomiting, shortness of breath, chest - please go to hospital. Stop smoking!! Schedule a follow up appointment with Dr. Raymondo Band in Pharmacy Clinic for smoking cessation and PFT. Follow up with your Infectious Disease specialist. Follow up with me in 1 month.  Cough, Adult  A cough is a reflex. It helps you clear your throat and airways. A cough can help heal your body. A cough can last 2 or 3 weeks (acute) or may last more than 8 weeks (chronic). Some common causes of a cough can include an infection, allergy, or a cold. HOME CARE  Only take medicine as told by your doctor.   If given, take your medicines (antibiotics) as told. Finish them even if you start to feel better.   Use a cold steam vaporizer or humidier in your home. This can help loosen thick spit (secretions).   Sleep so you are almost sitting up (semi-upright). Use pillows to do this. This helps reduce coughing.   Rest as needed.   Stop smoking if you smoke.  GET HELP RIGHT AWAY IF:  You have yellowish-white fluid (pus) in your thick spit.   Your cough gets worse.   Your medicine does not reduce coughing, and you are losing sleep.   You cough up blood.   You have trouble breathing.   Your pain gets worse and medicine does not help.   You have a fever.  MAKE SURE YOU:   Understand these instructions.   Will watch your condition.   Will get help right away if you are not doing well or get worse.  Document Released: 04/02/2011 Document Reviewed: 01/26/2011 [facility] Patient Information 2012 [place], [place].

## 2011-09-08 NOTE — Assessment & Plan Note (Signed)
Chronic cough likely multifactorial in etiology- likely GERD versus smoker's cough. Advised patient to stop smoking and to schedule an appointment with Dr. Romona Curls for smoking cessation and pulmonary function testing. Recommended over-the-counter antihistamine and Prilosec to treat GERD. Will order chest x-ray today to rule out malignancy or active disease process. Followup in one month.

## 2011-09-08 NOTE — Progress Notes (Signed)
  Subjective:    Patient ID: Connor Oneal, male    DOB: 04/28/1966, 46 y.o.   MRN: [id number]  HPI  Patient presents to clinic for followup ED visit for cough. Patient was diagnosed with upper respiratory infection with persisting cough. He was given a four-day burst of prednisone and albuterol inhaler. Patient says cough is now improved. The cough started 2 months ago. Described as a dry cough. At times he may cough up clear phlegm. He is a current smoker. He has been smoking one pack per day on and off for over 30 years. Patient has lost 10 pounds since October 2012, but he says that he went on a 30 day fast with his church and lost all that weight.   He denies any fever, chills, night sweats, chest pain, shortness of breath. He denies any symptoms of reflux, indigestion, abdominal pain. He was on Pepcid at last visit but since then he has stopped medication. Denies any hemoptysis, associated nasal congestion, rhinorrhea, vomiting.  Review of Systems  Per history of present illness    Objective:   Physical Exam  Constitutional: No distress.  HENT:  Mouth/Throat: Oropharynx is clear and moist. No oropharyngeal exudate.  Cardiovascular: Normal rate and normal heart sounds.   Pulmonary/Chest: Effort normal and breath sounds normal. He has no wheezes. He has no rales.  Lymphadenopathy:    He has no cervical adenopathy.          Assessment & Plan:

## 2011-09-08 NOTE — Assessment & Plan Note (Signed)
Advised patient to followup with infectious disease specialist her last ID note.

## 2011-09-09 ENCOUNTER — Encounter: Payer: Self-pay | Admitting: Family Medicine

## 2011-09-25 ENCOUNTER — Encounter: Payer: Self-pay | Admitting: Pharmacist

## 2011-09-25 ENCOUNTER — Ambulatory Visit (INDEPENDENT_AMBULATORY_CARE_PROVIDER_SITE_OTHER): Payer: Self-pay | Admitting: Pharmacist

## 2011-09-25 VITALS — BP 141/102 | HR 85 | Ht 68.5 in | Wt 191.1 lb

## 2011-09-25 DIAGNOSIS — F17200 Nicotine dependence, unspecified, uncomplicated: Secondary | ICD-10-CM

## 2011-09-25 DIAGNOSIS — Z720 Tobacco use: Secondary | ICD-10-CM | POA: Insufficient documentation

## 2011-09-25 NOTE — Progress Notes (Signed)
  Subjective:    Patient ID: Connor Oneal, male    DOB: 1966-05-06, 46 y.o.   MRN: [id number]  HPI Reviewed and agree with Dr. Macky Lower management.      Review of Systems     Objective:   Physical Exam        Assessment & Plan:

## 2011-09-25 NOTE — Assessment & Plan Note (Signed)
moderate Nicotine Dependence of 30 years duration in a patient who is fair candidate for success b/c of past history of quitting for > 3 months (twice) however current readiness to quit is only 3/10.  F/U phone call 3/42013  [phone])   F/U Rx Clinic Visit  TBD     Total time in face-to-face counseling 35 minutes.  Spirometry evaluation with Pre and Post Bronchodilator reveals near normal lung function.  Patient has been experiencing increasing smoker cough with sputum production for the last few years. Lung age Estimated to be 77 which was shared with patient.  Work on tobacco cessation.  Reviewed results of pulmonary function tests.  Pt verbalized understanding of results.   .   Total time in face to face counseling 35 minutes.

## 2011-09-25 NOTE — Progress Notes (Signed)
  Subjective:    Patient ID: Connor Oneal, male    DOB: 04-07-1966, 46 y.o.   MRN: [id number]  HPI  Age when started using tobacco on a daily basis 14. Number of Cigarettes per day 20. Brand smoked [place] 100 BOX. Estimated Nicotine Content per Cigarette (mg) 1.2.  Estimated Nicotine intake per day >20mg .   Most recent quit attempt - Quit from April till August 2012 Longest time ever been tobacco free 11 months (When he quit alcohol and drugs 4 years ago). What Medications (NRT, bupropion, varenicline) used in past includes Tried patches ("did not work").   Rates READINESS of quitting tobacco on 1-10 scale of 3. Rates CONFIDENCE of quitting tobacco on 1-10 scale of 10.  Stated he quit previously by use of PRAYER.     He is motivated at the current time by an upcoming dentist appointment on Monday Feb 25th.   Review of Systems     Objective:   Physical Exam        Assessment & Plan:  moderate Nicotine Dependence of 30 years duration in a patient who is fair candidate for success b/c of past history of quitting for > 3 months (twice) however current readiness to quit is only 3/10.  F/U phone call 3/42013  [phone])   F/U Rx Clinic Visit  TBD     Total time in face-to-face counseling 35 minutes.  Spirometry evaluation with Pre and Post Bronchodilator reveals near normal lung function.  Patient has been experiencing increasing smoker cough with sputum production for the last few years. Lung age Estimated to be 10 which was shared with patient.  Work on tobacco cessation.  Reviewed results of pulmonary function tests.  Pt verbalized understanding of results.   .   Total time in face to face counseling 35 minutes.

## 2011-09-25 NOTE — Patient Instructions (Signed)
Near normal lung function however lung age is estimated to be 16. Work on cutting back on cigarettes with this being your last pack.  Plan for phone call on 3/4

## 2011-10-02 ENCOUNTER — Ambulatory Visit: Payer: Self-pay | Admitting: Family Medicine

## 2011-10-05 ENCOUNTER — Telehealth: Payer: Self-pay | Admitting: Pharmacist

## 2011-10-05 NOTE — Telephone Encounter (Signed)
Phone Call Follow up of Attempted Quit attempt for tobacco cessation.   Patient denies quitting OR cutting down.   Patient MAY be interested in trying again in "about a month".   He stated he would prefer scheduling the next appointment.   He was encouraged to make an appointment to work on quitting in the next month.

## 2012-03-14 ENCOUNTER — Encounter (HOSPITAL_COMMUNITY): Payer: Self-pay

## 2012-03-14 ENCOUNTER — Emergency Department (HOSPITAL_COMMUNITY)
Admission: EM | Admit: 2012-03-14 | Discharge: 2012-03-14 | Disposition: A | Discharge status: Home / Self Care | Payer: PRIVATE HEALTH INSURANCE | Source: Home / Self Care | Attending: Family Medicine | Admitting: Family Medicine

## 2012-03-14 DIAGNOSIS — J020 Streptococcal pharyngitis: Secondary | ICD-10-CM

## 2012-03-14 LAB — POCT RAPID STREP A: Streptococcus, Group A Screen (Direct): POSITIVE — AB

## 2012-03-14 MED ORDER — ACETAMINOPHEN-CODEINE #3 300-30 MG PO TABS: 1.0000 | ORAL_TABLET | Freq: Four times a day (QID) | ORAL | Status: AC | PRN

## 2012-03-14 MED ORDER — IBUPROFEN 600 MG PO TABS: 600.0000 mg | ORAL_TABLET | Freq: Three times a day (TID) | ORAL | Status: AC | PRN

## 2012-03-14 MED ORDER — AMOXICILLIN 500 MG PO CAPS: 500.0000 mg | ORAL_CAPSULE | Freq: Three times a day (TID) | ORAL | Status: AC

## 2012-03-14 NOTE — ED Provider Notes (Signed)
History     CSN: [id number]  Arrival date & time 03/14/12  1214   First MD Initiated Contact with Patient 03/14/12 1321      Chief Complaint  Patient presents with  . Generalized Body Aches  . Cough  . Headache    (Consider location/radiation/quality/duration/timing/severity/associated sxs/prior treatment) HPI Comments: 46 year old smoker male with history of hepatitis C. Here complaining of headache, body aches and sore throat for 2 days. Has had nonproductive cough (not new) but denies nasal congestion or rhinorrhea. Denies chest pain shortness of breath or wheezing. No nausea vomiting or diarrhea. No abdominal pain. No rash. No known sick contacts.   Past Medical History  Diagnosis Date  . Hepatitis C, chronic   . Irregular heart beat   . GERD (gastroesophageal reflux disease) 08/10/2011    Past Surgical History  Procedure Date  . Hand surgery     Family History  Problem Relation Age of Onset  . COPD Mother   . Alcohol abuse Father   . COPD Father     History  Substance Use Topics  . Smoking status: Current Some Day Smoker -- 1.0 packs/day for 30 years    Types: Cigarettes  . Smokeless tobacco: Never Used  . 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  HENT: Positive for sore throat. Negative for congestion, rhinorrhea and trouble swallowing.   Eyes: Negative for discharge.  Respiratory: Positive for cough.   Musculoskeletal: Positive for myalgias.  Neurological: Positive for headaches.    Allergies  Review of patient's allergies indicates no known allergies.  Home Medications   Current Outpatient Rx  Name Route Sig Dispense Refill  . ACETAMINOPHEN-CODEINE #3 300-30 MG PO TABS Oral Take 1-2 tablets by mouth every 6 (six) hours as needed for pain. 15 tablet 0  . AMOXICILLIN 500 MG PO CAPS Oral Take 1 capsule (500 mg total) by mouth 3 (three) times  daily. 30 capsule 0  . CLOTRIMAZOLE 1 % EX CREA Topical Apply topically 2 (two) times daily. 30 g 0  . FAMOTIDINE 20 MG PO TABS Oral Take 1 tablet (20 mg total) by mouth 2 (two) times daily. 60 tablet 5  . IBUPROFEN 600 MG PO TABS Oral Take 1 tablet (600 mg total) by mouth every 8 (eight) hours as needed for pain or fever. 20 tablet 0  . OMEPRAZOLE 40 MG PO CPDR Oral Take 1 capsule (40 mg total) by mouth daily. 32 capsule 0  . TRIAMCINOLONE ACETONIDE 0.1 % EX OINT Topical Apply topically 2 (two) times daily. 30 g 0    BP 145/91  Pulse 80  Temp 98.5 F (36.9 C) (Oral)  Resp 18  SpO2 100%  Physical Exam  Nursing note and vitals reviewed. Constitutional: He is oriented to person, place, and time. He appears well-developed and well-nourished. No distress.  HENT:  Head: Normocephalic and atraumatic.  Right Ear: External ear normal.  Left Ear: External ear normal.       Nose normal. Significant pharyngeal erythema no exudates. No uvula deviation. No trismus. TM's normal.  Eyes: Conjunctivae are normal. Pupils are equal, round, and reactive to light. No scleral icterus.  Neck: Neck supple.  Cardiovascular: Normal rate, regular rhythm and normal heart sounds.   Pulmonary/Chest: Effort normal and breath sounds normal. He has no wheezes. He has no rales.  Lymphadenopathy:    He has  cervical adenopathy.  Neurological: He is alert and oriented to person, place, and time.  Skin: No rash noted.    ED Course  Procedures (including critical care time)  Labs Reviewed  POCT RAPID STREP A ([facility] ONLY) - Abnormal; Notable for the following:    Streptococcus, Group A Screen (Direct) POSITIVE (*)     All other components within normal limits   No results found.   1. Strep throat       MDM  Positive rapid strep test. Treated with amoxicillin, ibuprofen and Tylenol No. 3. Encouraged to quit smoking. Asked to return if worsening symptoms despite following treatment.  Sharin Grave, MD 03/15/12 [phone]

## 2012-03-14 NOTE — ED Notes (Signed)
C/o chills, body aches, non-productive cough and "slight" headache since Saturday night.  Denies other cold sx, n/v or diarrhea.

## 2012-10-05 ENCOUNTER — Emergency Department (HOSPITAL_COMMUNITY): Payer: PRIVATE HEALTH INSURANCE

## 2012-10-05 ENCOUNTER — Encounter (HOSPITAL_COMMUNITY): Payer: Self-pay | Admitting: *Deleted

## 2012-10-05 ENCOUNTER — Emergency Department (HOSPITAL_COMMUNITY)
Admission: EM | Admit: 2012-10-05 | Discharge: 2012-10-05 | Disposition: A | Discharge status: Home / Self Care | Payer: PRIVATE HEALTH INSURANCE | Attending: Emergency Medicine | Admitting: Emergency Medicine

## 2012-10-05 DIAGNOSIS — R05 Cough: Secondary | ICD-10-CM | POA: Insufficient documentation

## 2012-10-05 DIAGNOSIS — Z8619 Personal history of other infectious and parasitic diseases: Secondary | ICD-10-CM | POA: Insufficient documentation

## 2012-10-05 DIAGNOSIS — Z8719 Personal history of other diseases of the digestive system: Secondary | ICD-10-CM | POA: Insufficient documentation

## 2012-10-05 DIAGNOSIS — J9819 Other pulmonary collapse: Secondary | ICD-10-CM | POA: Insufficient documentation

## 2012-10-05 DIAGNOSIS — F17200 Nicotine dependence, unspecified, uncomplicated: Secondary | ICD-10-CM | POA: Insufficient documentation

## 2012-10-05 DIAGNOSIS — J9811 Atelectasis: Secondary | ICD-10-CM

## 2012-10-05 DIAGNOSIS — R0781 Pleurodynia: Secondary | ICD-10-CM

## 2012-10-05 DIAGNOSIS — R059 Cough, unspecified: Secondary | ICD-10-CM | POA: Insufficient documentation

## 2012-10-05 DIAGNOSIS — R6889 Other general symptoms and signs: Secondary | ICD-10-CM | POA: Insufficient documentation

## 2012-10-05 DIAGNOSIS — R0789 Other chest pain: Secondary | ICD-10-CM | POA: Insufficient documentation

## 2012-10-05 DIAGNOSIS — Z8679 Personal history of other diseases of the circulatory system: Secondary | ICD-10-CM | POA: Insufficient documentation

## 2012-10-05 LAB — CBC
HCT: 44.4 % (ref 39.0–52.0)
Hemoglobin: 15.4 g/dL (ref 13.0–17.0)
MCH: 28.7 pg (ref 26.0–34.0)
MCHC: 34.7 g/dL (ref 30.0–36.0)
MCV: 82.8 fL (ref 78.0–100.0)
Platelets: 285 10*3/uL (ref 150–400)
RBC: 5.36 MIL/uL (ref 4.22–5.81)
RDW: 13.6 % (ref 11.5–15.5)
WBC: 8.1 10*3/uL (ref 4.0–10.5)

## 2012-10-05 LAB — BASIC METABOLIC PANEL
BUN: 6 mg/dL (ref 6–23)
CO2: 27 mEq/L (ref 19–32)
Calcium: 9.4 mg/dL (ref 8.4–10.5)
Chloride: 105 mEq/L (ref 96–112)
Creatinine, Ser: 0.89 mg/dL (ref 0.50–1.35)
GFR calc Af Amer: >90 mL/min (ref >90)
GFR calc non Af Amer: >90 mL/min (ref >90)
Glucose, Bld: 86 mg/dL (ref 70–99)
Potassium: 3.9 mEq/L (ref 3.5–5.1)
Sodium: 141 mEq/L (ref 135–145)

## 2012-10-05 LAB — TROPONIN I: Troponin I: <0.3 ng/mL (ref <0.30)

## 2012-10-05 MED ORDER — KETOROLAC TROMETHAMINE 60 MG/2ML IM SOLN
60.0000 mg | Freq: Once | INTRAMUSCULAR | Status: AC
  Administered 2012-10-05: 60 mg via INTRAMUSCULAR
  Filled 2012-10-05: qty 2

## 2012-10-05 MED ORDER — NAPROXEN 500 MG PO TABS: 500.0000 mg | ORAL_TABLET | Freq: Two times a day (BID) | ORAL | Status: DC

## 2012-10-05 NOTE — ED Provider Notes (Signed)
History     CSN: [id number]  Arrival date & time 10/05/12  7846   First MD Initiated Contact with Patient 10/05/12 [phone]      Chief Complaint  Patient presents with  . Chest Pain    (Consider location/radiation/quality/duration/timing/severity/associated sxs/prior treatment) HPI Comments: 47 year old male with a history of hepatitis C who does not have high blood pressure, does not have diabetes, does not have cancer. He does smoke cigarettes. He presents with approximately 2 days of intermittent sharp left-sided lateral chest pain. This is intermittent, worse with taking a deep breath, worse with coughing, worse with sneezing. It is not associated with fevers or chills, nausea or vomiting, diaphoresis. He has no swelling of his legs, no recent travel, trauma, immobilization, hormone use, history of cancer or swelling of the legs. The patient has been seen at urgent care twice for this pain when he had to leave work because it hurt. He states that he was told he had an enlarged heart by the urgent care, had blood work done yesterday, the pain came back this morning and he felt like he needed to be reevaluated. The pain is not exertional  Patient is a 47 y.o. male presenting with chest pain. The history is provided by the patient.  Chest Pain   Past Medical History  Diagnosis Date  . Hepatitis C, chronic   . Irregular heart beat   . GERD (gastroesophageal reflux disease) 08/10/2011    Past Surgical History  Procedure Laterality Date  . Hand surgery      Family History  Problem Relation Age of Onset  . COPD Mother   . Alcohol abuse Father   . COPD Father     History  Substance Use Topics  . Smoking status: Current Some Day Smoker -- 1.00 packs/day for 30 years    Types: Cigarettes  . Smokeless tobacco: Never Used  . Alcohol Use: No     Comment: recovering alcoholic - quit drinking 3.5 years ago      Review of Systems  Cardiovascular: Positive for chest pain.  All other  systems reviewed and are negative.    Allergies  Review of patient's allergies indicates no known allergies.  Home Medications   Current Outpatient Rx  Name  Route  Sig  Dispense  Refill  . ampicillin (PRINCIPEN) 500 MG capsule   Oral   Take 500 mg by mouth 4 (four) times daily.         Marland Kitchen HYDROcodone-acetaminophen (NORCO/VICODIN) 5-325 MG per tablet   Oral   Take 1 tablet by mouth every 6 (six) hours as needed for pain. For pain           BP 133/93  Pulse 67  Temp(Src) 98.2 F (36.8 C) (Oral)  Resp 18  SpO2 99%  Physical Exam  Nursing note and vitals reviewed. Constitutional: He appears well-developed and well-nourished. No distress.  HENT:  Head: Normocephalic and atraumatic.  Mouth/Throat: Oropharynx is clear and moist. No oropharyngeal exudate.  Eyes: Conjunctivae and EOM are normal. Pupils are equal, round, and reactive to light. Right eye exhibits no discharge. Left eye exhibits no discharge. No scleral icterus.  Neck: Normal range of motion. Neck supple. No JVD present. No thyromegaly present.  Cardiovascular: Normal rate, regular rhythm, normal heart sounds and intact distal pulses.  Exam reveals no gallop and no friction rub.   No murmur heard. Pulmonary/Chest: Effort normal and breath sounds normal. No respiratory distress. He has no wheezes. He has no rales.  Abdominal: Soft. Bowel sounds are normal. He exhibits no distension and no mass. There is no tenderness.  Musculoskeletal: Normal range of motion. He exhibits no edema and no tenderness.  Lymphadenopathy:    He has no cervical adenopathy.  Neurological: He is alert. Coordination normal.  Skin: Skin is warm and dry. No rash noted. No erythema.  Psychiatric: He has a normal mood and affect. His behavior is normal.    ED Course  Procedures (including critical care time)  Labs Reviewed  TROPONIN I  BASIC METABOLIC PANEL  CBC   Dg Chest 2 View  10/05/2012  *RADIOLOGY REPORT*  Clinical Data: Short  of breath.  Smoker.  CHEST - 2 VIEW  Comparison: 09/08/2011  Findings: Normal heart size.  No pleural effusion or edema identified.  Scar versus plate-like atelectasis is identified within both lung bases.  No airspace consolidation identified.  IMPRESSION:  1.  Bibasilar scar versus plate-like atelectasis. 2.  No evidence for pneumonia.   Original Report Authenticated By: Signa Kell, M.D.      1. Pleuritic chest pain   2. Atelectasis       MDM  Pt has some pain with deep breathing, no abnormal lung sounds, no rf for PE, no rf for ACS other than smoking.  Non exertional sharp CP.  Needs to have some blood work but with normal ECG, r/o PTX, likely pleurisy  ED ECG REPORT  I personally interpreted this EKG   Date: 10/05/2012   Rate: 78  Rhythm: normal sinus rhythm  QRS Axis: normal  Intervals: normal  ST/T Wave abnormalities: normal  Conduction Disutrbances:none  Narrative Interpretation:   Old EKG Reviewed: none available   Pt has no signs of elevated troponin, has chest x-ray showing platelike atelectasis, vital signs remain normal, patient low risk for cardiac chest pain and stable for discharge with followup with family doctor on nonsteroidal anti-inflammatory.  PA and lateral views of the chest were obtained by digital radiography. I have personally interpreted these x-rays and find her to be no signs of pulmonary infiltrate, cardiomegaly, subdiaphragmatic free air, soft tissue abnormality, no obvious bony abnormalities or fractures.  There is atelectasis at the left base      Vida Roller, MD 10/05/12 [phone]

## 2012-10-05 NOTE — ED Notes (Signed)
Patient is alert and oriented x3.  He is complaining of chest pain that started 2 days ago. He states that he has had this pain before and he says it just went away but this time it Has not.  Currently he rates his pain 8 of 10 without nausea

## 2013-08-03 HISTORY — PX: LIVER BIOPSY: SHX301

## 2013-08-10 ENCOUNTER — Emergency Department (HOSPITAL_COMMUNITY)
Admission: EM | Admit: 2013-08-10 | Discharge: 2013-08-10 | Disposition: A | Discharge status: Home / Self Care | Payer: PRIVATE HEALTH INSURANCE | Source: Home / Self Care | Attending: Family Medicine | Admitting: Family Medicine

## 2013-08-10 ENCOUNTER — Encounter (HOSPITAL_COMMUNITY): Payer: Self-pay | Admitting: Emergency Medicine

## 2013-08-10 ENCOUNTER — Emergency Department (INDEPENDENT_AMBULATORY_CARE_PROVIDER_SITE_OTHER): Payer: PRIVATE HEALTH INSURANCE

## 2013-08-10 DIAGNOSIS — R059 Cough, unspecified: Secondary | ICD-10-CM

## 2013-08-10 DIAGNOSIS — R51 Headache: Secondary | ICD-10-CM

## 2013-08-10 DIAGNOSIS — R05 Cough: Secondary | ICD-10-CM

## 2013-08-10 DIAGNOSIS — B9789 Other viral agents as the cause of diseases classified elsewhere: Secondary | ICD-10-CM

## 2013-08-10 DIAGNOSIS — R519 Headache, unspecified: Secondary | ICD-10-CM

## 2013-08-10 DIAGNOSIS — B349 Viral infection, unspecified: Secondary | ICD-10-CM

## 2013-08-10 MED ORDER — BENZONATATE 200 MG PO CAPS: 200.0000 mg | ORAL_CAPSULE | Freq: Three times a day (TID) | ORAL | Status: DC | PRN

## 2013-08-10 MED ORDER — PREDNISONE 10 MG PO TABS: ORAL_TABLET | ORAL | Status: DC

## 2013-08-10 MED ORDER — MONTELUKAST SODIUM 10 MG PO TABS: 10.0000 mg | ORAL_TABLET | Freq: Every day | ORAL | Status: DC

## 2013-08-10 NOTE — Discharge Instructions (Signed)
Chronic Obstructive Pulmonary Disease °Chronic obstructive pulmonary disease (COPD) is a lung disease. The lungs become damaged. This makes it hard to get air in and out of your lungs. The damage to your lungs cannot be changed. There are things you can do to improve the lungs and make you feel better. °HOME CARE °· Take all medicines as told by your doctor. °· Use medicines that you breathe in (inhale) as told by your doctor. °· Avoid medicines or cough syrups that dry up your airway (antihistamines) and do not allow you to get rid of thick spit. °· If you smoke, stop. °· Avoid being around smoke, chemicals, and fumes that bother your breathing. °· Avoid people that have a catchy (contagious) sickness. °· Avoid going outside when it is very hot, cold, or humid. °· Use humidifiers in your home and near your bedside if it helps your breathing. °· Drink enough fluids to keep your pee (urine) clear or pale yellow. °· Eat healthy foods. Eat smaller meals more often and rest before meals. °· Ask your doctor if it is okay to take vitamins or pills with minerals (supplements). °· Exercise and stay active. °· Rest with activity. °· Get into a comfortable position when you have trouble breathing. °· Learn and use tips on how to relax. °· Learn and use tips on how to control your breathing as told by your doctor. Try: °· Breathing in through your nose for 1 second. Then, breath out (exhale) through your puckered (like a whistle) lips for 2 seconds. °· Putting one hand on your belly (abdomen). Breathe in slowly through your nose. Your hand on your belly should move out. Breathe out slowly through your puckered lips. Your hand on your belly should move in as you breathe out. °· Learn and use controlled coughing to clear thick spit from your lungs. °1. Lean your head slightly forward. °2. Breathe in deeply. °3. Try to hold your breath for 3 seconds. °4. Keep your mouth slightly open while coughing 2 times. °5. Spit any thick  spit out into a tissue. °6. Rest and do the steps again 1 or 2 times as needed. °· Get all shots (vaccines) a told by your doctor. °· Learn how to manage stress. °· Schedule and go to all follow-up doctor visits. °· Go to therapy that can help you improve your lungs (pulmonary rehabilitation) as told by your doctor. °· Use oxygen at home as told by your doctor. °GET HELP RIGHT AWAY IF:  °· You can feel your heart beating really fast. °· You have shortness of breath while resting. °· You have shortness of breath that stops you from being able to talk. °· You have shortness of breath that stops you from doing normal activities. °· You have chest pain lasting longer than 5 minutes. °· You start to shake uncontrollably (seizure). °· Your family or friends notice that you are flustered or confused. °· You cough up more thick spit than usual. °· There is a change in the color or thickness of the spit. °· Breathing is more difficult than usual. °· Your breathing is faster than usual. °· Your skin color is more blueish than usual. °· You are running out of the medicine you take for breathing. °· You are anxious, uneasy, fearful, or restless. °· You have a fever. °MAKE SURE YOU:  °· Understand these instructions. °· Will watch your condition. °· Will get help right away if you are not doing well or get worse. °  Document Released: 01/06/2008 Document Revised: 07/06/2012 Document Reviewed: 03/16/2013 [facility]Exit[facility] Patient Information 2014 [place], [place]. Cough, Adult  A cough is a reflex. It helps you clear your throat and airways. A cough can help heal your body. A cough can last 2 or 3 weeks (acute) or may last more than 8 weeks (chronic). Some common causes of a cough can include an infection, allergy, or a cold. HOME CARE  Only take medicine as told by your doctor.  If given, take your medicines (antibiotics) as told. Finish them even if you start to feel better.  Use a cold steam vaporizer or humidier in your home.  This can help loosen thick spit (secretions).  Sleep so you are almost sitting up (semi-upright). Use pillows to do this. This helps reduce coughing.  Rest as needed.  Stop smoking if you smoke. GET HELP RIGHT AWAY IF:  You have yellowish-white fluid (pus) in your thick spit.  Your cough gets worse.  Your medicine does not reduce coughing, and you are losing sleep.  You cough up blood.  You have trouble breathing.  Your pain gets worse and medicine does not help.  You have a fever. MAKE SURE YOU:   Understand these instructions.  Will watch your condition.  Will get help right away if you are not doing well or get worse. Document Released: 04/02/2011 Document Revised: 10/12/2011 Document Reviewed: 04/02/2011 [facility]saExitCare Patient Information 2014 [place], [place].

## 2013-08-10 NOTE — ED Provider Notes (Signed)
CSN: [id number]     Arrival date & time 08/10/13  1518 History   First MD Initiated Contact with Patient 08/10/13 1558     Chief Complaint  Patient presents with  . Cough   (Consider location/radiation/quality/duration/timing/severity/associated sxs/prior Treatment) HPI Comments: [age] yo male with dry cough x 6 month with HX of tobacco use. He notes over last week he has noticed increased difficulty with sleeping and wakes with a mild headache. He has been a little fatigued with sleep disturbance. He denies any production from cough/ SOB/ Edema. He has not tried any OTC for relief with cough or headache. He does not check his BP routinely or f/u AD with PCP.  Patient is a 48 y.o. male presenting with cough.  Cough Associated symptoms: headaches     Past Medical History  Diagnosis Date  . Hepatitis C, chronic   . Irregular heart beat   . GERD (gastroesophageal reflux disease) 08/10/2011   Past Surgical History  Procedure Laterality Date  . Hand surgery     Family History  Problem Relation Age of Onset  . COPD Mother   . Alcohol abuse Father   . COPD Father    History  Substance Use Topics  . Smoking status: Current Some Day Smoker -- 1.00 packs/day for 30 years    Types: Cigarettes  . Smokeless tobacco: Never Used  . 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  HENT: Positive for postnasal drip.   Respiratory: Positive for cough.   Neurological: Positive for headaches.  All other systems reviewed and are negative.    Allergies  Review of patient's allergies indicates no known allergies.  Home Medications   Current Outpatient Rx  Name  Route  Sig  Dispense  Refill  . ampicillin (PRINCIPEN) 500 MG capsule   Oral   Take 500 mg by mouth 4 (four) times daily.         . benzonatate (TESSALON) 200 MG capsule   Oral   Take 1 capsule (200 mg total) by mouth 3 (three) times  daily as needed for cough.   20 capsule   0   . HYDROcodone-acetaminophen (NORCO/VICODIN) 5-325 MG per tablet   Oral   Take 1 tablet by mouth every 6 (six) hours as needed for pain. For pain         . montelukast (SINGULAIR) 10 MG tablet   Oral   Take 1 tablet (10 mg total) by mouth at bedtime.   30 tablet   1   . naproxen (NAPROSYN) 500 MG tablet   Oral   Take 1 tablet (500 mg total) by mouth 2 (two) times daily with a meal.   30 tablet   0   . predniSONE (DELTASONE) 10 MG tablet      1 po TID x 3 days, 1 PO BID x 3 days, 1 po QD x 5 days   20 tablet   0    BP 132/90  Pulse 69  Temp(Src) 98.4 F (36.9 C) (Oral)  Resp 18  SpO2 100% Physical Exam  Nursing note and vitals reviewed. Constitutional: He is oriented to person, place, and time. He appears well-developed and well-nourished.  HENT:  Head: Normocephalic and atraumatic.  Right Ear: External ear normal.  Left Ear: External ear normal.  Nose: Nose normal.  Mouth/Throat: Oropharynx is clear and moist. No oropharyngeal exudate.  Injected TMs bilateral [place] Post Pharynx, minimal erythema  Eyes: Conjunctivae and EOM are normal. Pupils are equal, round, and reactive to light.  Neck: Normal range of motion.  Cardiovascular: Normal rate, regular rhythm, normal heart sounds and intact distal pulses.   Pulmonary/Chest: Effort normal and breath sounds normal.  Dry barky cough  Abdominal: Soft.  Musculoskeletal: Normal range of motion.  Lymphadenopathy:    He has no cervical adenopathy.  Neurological: He is alert and oriented to person, place, and time. No cranial nerve deficit. Coordination normal.  Skin: Skin is warm and dry.  Psychiatric: He has a normal mood and affect. Judgment normal.    ED Course  Procedures (including critical care time) Labs Review Labs Reviewed - No data to display Imaging Review Dg Chest 2 View  08/10/2013   CLINICAL DATA:  Chronic nonproductive cough, history of tobacco  use.  EXAM: CHEST  2 VIEW  COMPARISON:  PA and lateral chest x-ray dated October 05, 2012.  FINDINGS: The lungs are adequately inflated. There is stable increased density at the lung bases consistent with scarring. There is no alveolar infiltrate. There is no pleural effusion. No pulmonary parenchymal masses or nodules are demonstrated. The cardiac silhouette is normal in size. The pulmonary vascularity is not engorged. The mediastinum is normal in width. There is mild tortuosity of the descending thoracic aorta. The observed portions of the bony thorax appear normal.  IMPRESSION: There are chronic changes at both lung bases most compatible with scarring. There is no evidence of pneumonia nor CHF. Given the patient's chronic symptoms and history of tobacco use, chest CT scanning may be prudent.   Electronically Signed   By: David  [place]   On: 08/10/2013 16:50      MDM  1. Cough/ Headache/ probable viral illness with Tobacco HX- Needs PCP for further evaluation of probable COPD, may benefit from sleep study with a.m. HA. Pred DP 10 mg AD, Tessalon Perles 200mg  AD, Singulair 10 mg AD, OTC Mucinex AD until d/c tobacco, get OTC Allegra if insurance will not cover Singulair. Increase H2o intake if SX increase ER. 2. Elevated Bp without HTN- Needs to recheck in 1 week may also contribute to HA. Needs PCP evaluation   Berenice Primas, PA-C 08/10/13 2123

## 2013-08-10 NOTE — ED Notes (Signed)
Pt c/o persistent dry cough >6 months... Also c/o HA onset 3 days Denies: f/v/n/d, wheezing... Smokes 1 PPD... Taking OTC cold meds w/no relief He is alert w/no signs of acute distress.

## 2013-08-11 NOTE — ED Notes (Signed)
Called. Was reportedly told by provider yesterday , he needed something to break up congestion, but was not provided with a Rx. Provider not here today, spoke w Dr Gala LewandowskyJ D Kindl, who wishes to advise patient he can instead use OTC  Mucinex BID w plenty of fluids to help liquify secretions. Patient was instructed , and will follow up if no improvement

## 2013-08-14 NOTE — ED Provider Notes (Signed)
Medical screening examination/treatment/procedure(s) were performed by resident physician or non-physician practitioner and as supervising physician I was immediately available for consultation/collaboration.   Barkley BrunsKINDL,Chieko Neises DOUGLAS MD.   Linna HoffJames D Savina Olshefski, MD 08/14/13 [phone]

## 2013-08-15 ENCOUNTER — Telehealth (HOSPITAL_COMMUNITY): Payer: Self-pay | Admitting: Emergency Medicine

## 2013-08-15 NOTE — ED Notes (Signed)
Patient came in stating he was supposed to have a prescription for :syrup"  I reviewed patient's chart and there was not a mention of syrup.  After further discussion with patient he states his friend got a "syrup" and it helped him.  I explained to patient we couldn't give him and RX for a syrup because it has narcotics.  Patient then states he is recovering addict.  I explained to patient that was another indication that we could not give him the "syrup"  Patient expressed understanding and was told get to get Delsym OTC.  Chart reviewed by Dr Denyse Amassorey

## 2013-08-16 NOTE — ED Notes (Signed)
Chart review.

## 2013-08-22 NOTE — ED Notes (Signed)
Pt. called and ask for " the cough syrup" that Loree FeeMelissa Smith said she was going to prescribe for him.  I told him that she had prescribed Tessalon Perles for his cough instead.  He said they did not help and he is still coughing. I told him it has been 12 days.  We would not prescribe cough syrup at this point. I suggested he call his PCP @ [facility] to get rechecked. Vassie MoselleYork, Idalia Allbritton M 08/22/2013

## 2013-09-25 DIAGNOSIS — B182 Chronic viral hepatitis C: Secondary | ICD-10-CM | POA: Insufficient documentation

## 2013-09-25 DIAGNOSIS — R9389 Abnormal findings on diagnostic imaging of other specified body structures: Secondary | ICD-10-CM | POA: Insufficient documentation

## 2013-09-26 DIAGNOSIS — J449 Chronic obstructive pulmonary disease, unspecified: Secondary | ICD-10-CM | POA: Insufficient documentation

## 2014-01-10 ENCOUNTER — Telehealth: Payer: Self-pay

## 2014-01-10 NOTE — Telephone Encounter (Signed)
Left message for call back Non-identifiable   Td- 05/14/11

## 2014-01-10 NOTE — Telephone Encounter (Signed)
Medication List and allergies:  Updated and Reviewed  90 day supply/mail order: n/a Local prescriptions:  [id number] - [place], [place] - [address] [facility] [address]  Immunization due:  UTD  A/P: No changes to personal, family or PSH Flu- did not receive  Tdap- 05/14/11   To discuss with provider: Nothing at this time.

## 2014-01-11 ENCOUNTER — Encounter: Payer: Self-pay | Admitting: Family Medicine

## 2014-01-11 ENCOUNTER — Ambulatory Visit (INDEPENDENT_AMBULATORY_CARE_PROVIDER_SITE_OTHER): Payer: PRIVATE HEALTH INSURANCE | Admitting: Family Medicine

## 2014-01-11 VITALS — BP 142/94 | HR 83 | Temp 98.0°F | Ht 68.0 in | Wt 185.0 lb

## 2014-01-11 DIAGNOSIS — I10 Essential (primary) hypertension: Secondary | ICD-10-CM

## 2014-01-11 DIAGNOSIS — F17200 Nicotine dependence, unspecified, uncomplicated: Secondary | ICD-10-CM

## 2014-01-11 DIAGNOSIS — Z23 Encounter for immunization: Secondary | ICD-10-CM

## 2014-01-11 DIAGNOSIS — Z0000 Encounter for general adult medical examination without abnormal findings: Secondary | ICD-10-CM

## 2014-01-11 DIAGNOSIS — F17209 Nicotine dependence, unspecified, with unspecified nicotine-induced disorders: Secondary | ICD-10-CM

## 2014-01-11 MED ORDER — LISINOPRIL 10 MG PO TABS: 10.0000 mg | ORAL_TABLET | Freq: Every day | ORAL | Status: DC

## 2014-01-11 NOTE — Progress Notes (Signed)
Pre visit review using our clinic review tool, if applicable. No additional management support is needed unless otherwise documented below in the visit note. 

## 2014-01-11 NOTE — Patient Instructions (Signed)
Preventive Care for Adults, Male A healthy lifestyle and preventive care can promote health and wellness. Preventive health guidelines for men include the following key practices:  A routine yearly physical is a good way to check with your health care provider about your health and preventative screening. It is a chance to share any concerns and updates on your health and to receive a thorough exam.  Visit your dentist for a routine exam and preventative care every 6 months. Brush your teeth twice a day and floss once a day. Good oral hygiene prevents tooth decay and gum disease.  The frequency of eye exams is based on your age, health, family medical history, use of contact lenses, and other factors. Follow your health care provider's recommendations for frequency of eye exams.  Eat a healthy diet. Foods such as vegetables, fruits, whole grains, low-fat dairy products, and lean protein foods contain the nutrients you need without too many calories. Decrease your intake of foods high in solid fats, added sugars, and salt. Eat the right amount of calories for you.Get information about a proper diet from your health care provider, if necessary.  Regular physical exercise is one of the most important things you can do for your health. Most adults should get at least 150 minutes of moderate-intensity exercise (any activity that increases your heart rate and causes you to sweat) each week. In addition, most adults need muscle-strengthening exercises on 2 or more days a week.  Maintain a healthy weight. The body mass index (BMI) is a screening tool to identify possible weight problems. It provides an estimate of body fat based on height and weight. Your health care provider can find your BMI and can help you achieve or maintain a healthy weight.For adults 20 years and older:  A BMI below 18.5 is considered underweight.  A BMI of 18.5 to 24.9 is normal.  A BMI of 25 to 29.9 is considered  overweight.  A BMI of 30 and above is considered obese.  Maintain normal blood lipids and cholesterol levels by exercising and minimizing your intake of saturated fat. Eat a balanced diet with plenty of fruit and vegetables. Blood tests for lipids and cholesterol should begin at age 42 and be repeated every 5 years. If your lipid or cholesterol levels are high, you are over 50, or you are at high risk for heart disease, you may need your cholesterol levels checked more frequently.Ongoing high lipid and cholesterol levels should be treated with medicines if diet and exercise are not working.  If you smoke, find out from your health care provider how to quit. If you do not use tobacco, do not start.  Lung cancer screening is recommended for adults aged 24 80 years who are at high risk for developing lung cancer because of a history of smoking. A yearly low-dose CT scan of the lungs is recommended for people who have at least a 30-pack-year history of smoking and are a current smoker or have quit within the past 15 years. A pack year of smoking is smoking an average of 1 pack of cigarettes a day for 1 year (for example: 1 pack a day for 30 years or 2 packs a day for 15 years). Yearly screening should continue until the smoker has stopped smoking for at least 15 years. Yearly screening should be stopped for people who develop a health problem that would prevent them from having lung cancer treatment.  If you choose to drink alcohol, do not have  more than 2 drinks per day. One drink is considered to be 12 ounces (355 mL) of beer, 5 ounces (148 mL) of wine, or 1.5 ounces (44 mL) of liquor.  Avoid use of street drugs. Do not share needles with anyone. Ask for help if you need support or instructions about stopping the use of drugs.  High blood pressure causes heart disease and increases the risk of stroke. Your blood pressure should be checked at least every 1 2 years. Ongoing high blood pressure should be  treated with medicines, if weight loss and exercise are not effective.  If you are 75 48 years old, ask your health care provider if you should take aspirin to prevent heart disease.  Diabetes screening involves taking a blood sample to check your fasting blood sugar level. This should be done once every 3 years, after age 19, if you are within normal weight and without risk factors for diabetes. Testing should be considered at a younger age or be carried out more frequently if you are overweight and have at least 1 risk factor for diabetes.  Colorectal cancer can be detected and often prevented. Most routine colorectal cancer screening begins at the age of 47 and continues through age 80. However, your health care provider may recommend screening at an earlier age if you have risk factors for colon cancer. On a yearly basis, your health care provider may provide home test kits to check for hidden blood in the stool. Use of a small camera at the end of a tube to directly examine the colon (sigmoidoscopy or colonoscopy) can detect the earliest forms of colorectal cancer. Talk to your health care provider about this at age 66, when routine screening begins. Direct exam of the colon should be repeated every 5 10 years through age 19, unless early forms of precancerous polyps or small growths are found.  People who are at an increased risk for hepatitis B should be screened for this virus. You are considered at high risk for hepatitis B if:  You were born in a country where hepatitis B occurs often. Talk with your health care provider about which countries are considered high-risk.  Your parents were born in a high-risk country and you have not received a shot to protect against hepatitis B (hepatitis B vaccine).  You have HIV or AIDS.  You use needles to inject street drugs.  You live with, or have sex with, someone who has hepatitis B.  You are a man who has sex with other men (MSM).  You get  hemodialysis treatment.  You take certain medicines for conditions such as cancer, organ transplantation, and autoimmune conditions.  Hepatitis C blood testing is recommended for all people born from [birth year] through 1965 and any individual with known risks for hepatitis C.  Practice safe sex. Use condoms and avoid high-risk sexual practices to reduce the spread of sexually transmitted infections (STIs). STIs include gonorrhea, chlamydia, syphilis, trichomonas, herpes, HPV, and human immunodeficiency virus (HIV). Herpes, HIV, and HPV are viral illnesses that have no cure. They can result in disability, cancer, and death.  A one-time screening for abdominal aortic aneurysm (AAA) and surgical repair of large AAAs by ultrasound are recommended for men ages 94 to 74 years who are current or former smokers.  Healthy men should no longer receive prostate-specific antigen (PSA) blood tests as part of routine cancer screening. Talk with your health care provider about prostate cancer screening.  Testicular cancer screening is not recommended  for adult males who have no symptoms. Screening includes self-exam, a health care provider exam, and other screening tests. Consult with your health care provider about any symptoms you have or any concerns you have about testicular cancer.  Use sunscreen. Apply sunscreen liberally and repeatedly throughout the day. You should seek shade when your shadow is shorter than you. Protect yourself by wearing long sleeves, pants, a wide-brimmed hat, and sunglasses year round, whenever you are outdoors.  Once a month, do a whole-body skin exam, using a mirror to look at the skin on your back. Tell your health care provider about new moles, moles that have irregular borders, moles that are larger than a pencil eraser, or moles that have changed in shape or color.  Stay current with required vaccines (immunizations).  Influenza vaccine. All adults should be immunized every  year.  Tetanus, diphtheria, and acellular pertussis (Td, Tdap) vaccine. An adult who has not previously received Tdap or who does not know his vaccine status should receive 1 dose of Tdap. This initial dose should be followed by tetanus and diphtheria toxoids (Td) booster doses every 10 years. Adults with an unknown or incomplete history of completing a 3-dose immunization series with Td-containing vaccines should begin or complete a primary immunization series including a Tdap dose. Adults should receive a Td booster every 10 years.  Varicella vaccine. An adult without evidence of immunity to varicella should receive 2 doses or a second dose if he has previously received 1 dose.  Human papillomavirus (HPV) vaccine. Males aged 44 21 years who have not received the vaccine previously should receive the 3-dose series. Males aged 43 26 years may be immunized. Immunization is recommended through the age of 50 years for any male who has sex with males and did not get any or all doses earlier. Immunization is recommended for any person with an immunocompromised condition through the age of 23 years if he did not get any or all doses earlier. During the 3-dose series, the second dose should be obtained 4 8 weeks after the first dose. The third dose should be obtained 24 weeks after the first dose and 16 weeks after the second dose.  Zoster vaccine. One dose is recommended for adults aged 96 years or older unless certain conditions are present.  Measles, mumps, and rubella (MMR) vaccine. Adults born before 55 generally are considered immune to measles and mumps. Adults born in [birth year] or later should have 1 or more doses of MMR vaccine unless there is a contraindication to the vaccine or there is laboratory evidence of immunity to each of the three diseases. A routine second dose of MMR vaccine should be obtained at least 28 days after the first dose for students attending postsecondary schools, health care  workers, or international travelers. People who received inactivated measles vaccine or an unknown type of measles vaccine during 1963 1967 should receive 2 doses of MMR vaccine. People who received inactivated mumps vaccine or an unknown type of mumps vaccine before 1979 and are at high risk for mumps infection should consider immunization with 2 doses of MMR vaccine. Unvaccinated health care workers born before [birth year] who lack laboratory evidence of measles, mumps, or rubella immunity or laboratory confirmation of disease should consider measles and mumps immunization with 2 doses of MMR vaccine or rubella immunization with 1 dose of MMR vaccine.  Pneumococcal 13-valent conjugate (PCV13) vaccine. When indicated, a person who is uncertain of his immunization history and has no record of immunization  should receive the PCV13 vaccine. An adult aged 67 years or older who has certain medical conditions and has not been previously immunized should receive 1 dose of PCV13 vaccine. This PCV13 should be followed with a dose of pneumococcal polysaccharide (PPSV23) vaccine. The PPSV23 vaccine dose should be obtained at least 8 weeks after the dose of PCV13 vaccine. An adult aged 79 years or older who has certain medical conditions and previously received 1 or more doses of PPSV23 vaccine should receive 1 dose of PCV13. The PCV13 vaccine dose should be obtained 1 or more years after the last PPSV23 vaccine dose.  Pneumococcal polysaccharide (PPSV23) vaccine. When PCV13 is also indicated, PCV13 should be obtained first. All adults aged 74 years and older should be immunized. An adult younger than age 50 years who has certain medical conditions should be immunized. Any person who resides in a nursing home or long-term care facility should be immunized. An adult smoker should be immunized. People with an immunocompromised condition and certain other conditions should receive both PCV13 and PPSV23 vaccines. People with human  immunodeficiency virus (HIV) infection should be immunized as soon as possible after diagnosis. Immunization during chemotherapy or radiation therapy should be avoided. Routine use of PPSV23 vaccine is not recommended for American Indians, [place] Natives, or people younger than 65 years unless there are medical conditions that require PPSV23 vaccine. When indicated, people who have unknown immunization and have no record of immunization should receive PPSV23 vaccine. One-time revaccination 5 years after the first dose of PPSV23 is recommended for people aged 41 64 years who have chronic kidney failure, nephrotic syndrome, asplenia, or immunocompromised conditions. People who received 1 2 doses of PPSV23 before age 15 years should receive another dose of PPSV23 vaccine at age 48 years or later if at least 5 years have passed since the previous dose. Doses of PPSV23 are not needed for people immunized with PPSV23 at or after age 69 years.  Meningococcal vaccine. Adults with asplenia or persistent complement component deficiencies should receive 2 doses of quadrivalent meningococcal conjugate (MenACWY-D) vaccine. The doses should be obtained at least 2 months apart. Microbiologists working with certain meningococcal bacteria, [place] recruits, people at risk during an outbreak, and people who travel to or live in countries with a high rate of meningitis should be immunized. A first-year college student up through age 7 years who is living in a residence hall should receive a dose if he did not receive a dose on or after his 16th birthday. Adults who have certain high-risk conditions should receive one or more doses of vaccine.  Hepatitis A vaccine. Adults who wish to be protected from this disease, have certain high-risk conditions, work with hepatitis A-infected animals, work in hepatitis A research labs, or travel to or work in countries with a high rate of hepatitis A should be immunized. Adults who were  previously unvaccinated and who anticipate close contact with an international adoptee during the first 60 days after arrival in the [place] from a country with a high rate of hepatitis A should be immunized.  Hepatitis B vaccine. Adults who wish to be protected from this disease, have certain high-risk conditions, may be exposed to blood or other infectious body fluids, are household contacts or sex partners of hepatitis B positive people, are clients or workers in certain care facilities, or travel to or work in countries with a high rate of hepatitis B should be immunized.  Haemophilus influenzae type b (Hib) vaccine. A  previously unvaccinated person with asplenia or sickle cell disease or having a scheduled splenectomy should receive 1 dose of Hib vaccine. Regardless of previous immunization, a recipient of a hematopoietic stem cell transplant should receive a 3-dose series 6 12 months after his successful transplant. Hib vaccine is not recommended for adults with HIV infection. Preventive Service / Frequency Ages 62 to 3  Blood pressure check.** / Every 1 to 2 years.  Lipid and cholesterol check.** / Every 5 years beginning at age 43.  Hepatitis C blood test.** / For any individual with known risks for hepatitis C.  Skin self-exam. / Monthly.  Influenza vaccine. / Every year.  Tetanus, diphtheria, and acellular pertussis (Tdap, Td) vaccine.** / Consult your health care provider. 1 dose of Td every 10 years.  Varicella vaccine.** / Consult your health care provider.  HPV vaccine. / 3 doses over 6 months, if 48 or younger.  Measles, mumps, rubella (MMR) vaccine.** / You need at least 1 dose of MMR if you were born in 1957 or later. You may also need a second dose.  Pneumococcal 13-valent conjugate (PCV13) vaccine.** / Consult your health care provider.  Pneumococcal polysaccharide (PPSV23) vaccine.** / 1 to 2 doses if you smoke cigarettes or if you have certain  conditions.  Meningococcal vaccine.** / 1 dose if you are age 8 to 70 years and a Market researcher living in a residence hall, or have one of several medical conditions. You may also need additional booster doses.  Hepatitis A vaccine.** / Consult your health care provider.  Hepatitis B vaccine.** / Consult your health care provider.  Haemophilus influenzae type b (Hib) vaccine.** / Consult your health care provider. Ages 48 to 32  Blood pressure check.** / Every 1 to 2 years.  Lipid and cholesterol check.** / Every 5 years beginning at age 38.  Lung cancer screening. / Every year if you are aged 40 80 years and have a 30-pack-year history of smoking and currently smoke or have quit within the past 15 years. Yearly screening is stopped once you have quit smoking for at least 15 years or develop a health problem that would prevent you from having lung cancer treatment.  Fecal occult blood test (FOBT) of stool. / Every year beginning at age 4 and continuing until age 70. You may not have to do this test if you get a colonoscopy every 10 years.  Flexible sigmoidoscopy** or colonoscopy.** / Every 5 years for a flexible sigmoidoscopy or every 10 years for a colonoscopy beginning at age 76 and continuing until age 62.  Hepatitis C blood test.** / For all people born from [birth year] through 1965 and any individual with known risks for hepatitis C.  Skin self-exam. / Monthly.  Influenza vaccine. / Every year.  Tetanus, diphtheria, and acellular pertussis (Tdap/Td) vaccine.** / Consult your health care provider. 1 dose of Td every 10 years.  Varicella vaccine.** / Consult your health care provider.  Zoster vaccine.** / 1 dose for adults aged 60 years or older.  Measles, mumps, rubella (MMR) vaccine.** / You need at least 1 dose of MMR if you were born in 1957 or later. You may also need a second dose.  Pneumococcal 13-valent conjugate (PCV13) vaccine.** / Consult your health care  provider.  Pneumococcal polysaccharide (PPSV23) vaccine.** / 1 to 2 doses if you smoke cigarettes or if you have certain conditions.  Meningococcal vaccine.** / Consult your health care provider.  Hepatitis A vaccine.** / Consult your health care  provider.  Hepatitis B vaccine.** / Consult your health care provider.  Haemophilus influenzae type b (Hib) vaccine.** / Consult your health care provider. Ages 65 and over  Blood pressure check.** / Every 1 to 2 years.  Lipid and cholesterol check.**/ Every 5 years beginning at age 20.  Lung cancer screening. / Every year if you are aged 55 80 years and have a 30-pack-year history of smoking and currently smoke or have quit within the past 15 years. Yearly screening is stopped once you have quit smoking for at least 15 years or develop a health problem that would prevent you from having lung cancer treatment.  Fecal occult blood test (FOBT) of stool. / Every year beginning at age 50 and continuing until age 75. You may not have to do this test if you get a colonoscopy every 10 years.  Flexible sigmoidoscopy** or colonoscopy.** / Every 5 years for a flexible sigmoidoscopy or every 10 years for a colonoscopy beginning at age 50 and continuing until age 75.  Hepatitis C blood test.** / For all people born from 1945 through 1965 and any individual with known risks for hepatitis C.  Abdominal aortic aneurysm (AAA) screening.** / A one-time screening for ages 65 to 75 years who are current or former smokers.  Skin self-exam. / Monthly.  Influenza vaccine. / Every year.  Tetanus, diphtheria, and acellular pertussis (Tdap/Td) vaccine.** / 1 dose of Td every 10 years.  Varicella vaccine.** / Consult your health care provider.  Zoster vaccine.** / 1 dose for adults aged 60 years or older.  Pneumococcal 13-valent conjugate (PCV13) vaccine.** / Consult your health care provider.  Pneumococcal polysaccharide (PPSV23) vaccine.** / 1 dose for all  adults aged 65 years and older.  Meningococcal vaccine.** / Consult your health care provider.  Hepatitis A vaccine.** / Consult your health care provider.  Hepatitis B vaccine.** / Consult your health care provider.  Haemophilus influenzae type b (Hib) vaccine.** / Consult your health care provider. **Family history and personal history of risk and conditions may change your health care provider's recommendations. Document Released: 09/15/2001 Document Revised: 05/10/2013 Document Reviewed: 12/15/2010 [employer] Patient Information 2014 [employer]  

## 2014-01-11 NOTE — Addendum Note (Signed)
Addended by: Arnette Norris on: 01/11/2014 05:05 PM   Modules accepted: Orders

## 2014-01-11 NOTE — Progress Notes (Signed)
Subjective:    Patient ID: Connor Oneal, male    DOB: 1965-12-02, 48 y.o.   MRN: [id number] Pt here to establish and have cpe and labs.  Cough Pertinent negatives include no ear pain, headaches, postnasal drip or rhinorrhea.      Review of Systems  Constitutional: Negative.   HENT: Negative for congestion, ear pain, hearing loss, nosebleeds, postnasal drip, rhinorrhea, sinus pressure, sneezing and tinnitus.   Eyes: Negative for photophobia, discharge, itching and visual disturbance.  Respiratory: Positive for cough.   Cardiovascular: Negative.   Gastrointestinal: Negative for abdominal pain, constipation, blood in stool, abdominal distention and anal bleeding.  Endocrine: Negative.   Genitourinary: Negative.   Musculoskeletal: Negative.   Skin: Negative.   Allergic/Immunologic: Negative.   Neurological: Negative for dizziness, weakness, light-headedness, numbness and headaches.  Psychiatric/Behavioral: Negative for suicidal ideas, confusion, sleep disturbance, dysphoric mood, decreased concentration and agitation. The patient is not nervous/anxious.    Past Medical History  Diagnosis Date  . Hepatitis C, chronic   . Irregular heart beat   . GERD (gastroesophageal reflux disease) 08/10/2011  . Migraines    History   Social History  . Marital Status: Single    Spouse Name: N/A    Number of Children: N/A  . Years of Education: N/A   Occupational History  . Not on file.   Social History Main Topics  . Smoking status: Current Some Day Smoker -- 1.00 packs/day for 30 years    Types: Cigarettes  . Smokeless tobacco: Never Used  . 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 Drug Use: No  . Sexual Activity: Not on file   Other Topics Concern  . Not on file   Social History Narrative  . No narrative on file   Family History  Problem Relation Age of Onset  . COPD Mother     smoker  . COPD Father     smoker--Lung transplant     Current Outpatient Prescriptions  Medication Sig Dispense Refill  . lisinopril (PRINIVIL,ZESTRIL) 10 MG tablet Take 1 tablet (10 mg total) by mouth daily.  90 tablet  3   No current facility-administered medications for this visit.   No Known Allergies     Objective:   Physical Exam  Constitutional: He is oriented to person, place, and time. He appears well-developed and well-nourished. No distress.  HENT:  Head: Normocephalic and atraumatic.  Right Ear: External ear normal.  Left Ear: External ear normal.  Nose: Nose normal.  Mouth/Throat: Oropharynx is clear and moist. No oropharyngeal exudate.  Eyes: Conjunctivae and EOM are normal. Pupils are equal, round, and reactive to light. Right eye exhibits no discharge. Left eye exhibits no discharge.  Neck: Normal range of motion. Neck supple. No JVD present. No thyromegaly present.  Cardiovascular: Normal rate, regular rhythm and intact distal pulses.  Exam reveals no gallop and no friction rub.   No murmur heard. Pulmonary/Chest: Effort normal and breath sounds normal. No respiratory distress. He has no wheezes. He has no rales. He exhibits no tenderness.  Abdominal: Soft. Bowel sounds are normal. He exhibits no distension and no mass. There is no tenderness. There is no rebound and no guarding.  Genitourinary: Rectum normal, prostate normal and penis normal. Guaiac negative stool.  Musculoskeletal: Normal range of motion. He exhibits no edema and no tenderness.  Lymphadenopathy:    He has no cervical adenopathy.  Neurological: He is alert and oriented  to person, place, and time. He displays normal reflexes. He exhibits normal muscle tone.  Skin: Skin is warm and dry. No rash noted. He is not diaphoretic. No erythema. No pallor.  Psychiatric: He has a normal mood and affect. His behavior is normal. Judgment and thought content normal.          Assessment & Plan:  1. HTN (hypertension) Check labs rto 3 months - lisinopril  (PRINIVIL,ZESTRIL) 10 MG tablet; Take 1 tablet (10 mg total) by mouth daily.  Dispense: 90 tablet; Refill: 3 - Basic metabolic panel - CBC with Differential  2. Smoker Pt will con't to work on quitting  3. Preventative health care  ghm utd Check labs See AVS - Basic metabolic panel - CBC with Differential - Hepatic function panel - Lipid panel - POCT urinalysis dipstick - TSH - PSA - HIV antibody - RPR - GC/chlamydia probe amp, urine - RPR - HSV 2 antibody, IgG - Hepatitis C antibody  4. Tobacco use disorder, continuous  Encourage pt to quit

## 2014-01-12 ENCOUNTER — Telehealth: Payer: Self-pay | Admitting: Family Medicine

## 2014-01-12 LAB — BASIC METABOLIC PANEL
BUN: 9 mg/dL (ref 6–23)
CO2: 29 mEq/L (ref 19–32)
Calcium: 10.1 mg/dL (ref 8.4–10.5)
Chloride: 103 mEq/L (ref 96–112)
Creatinine, Ser: 0.9 mg/dL (ref 0.4–1.5)
GFR: 110.03 mL/min (ref >60.00)
Glucose, Bld: 82 mg/dL (ref 70–99)
Potassium: 4.1 mEq/L (ref 3.5–5.1)
Sodium: 140 mEq/L (ref 135–145)

## 2014-01-12 LAB — HIV ANTIBODY (ROUTINE TESTING W REFLEX): HIV 1&2 Ab, 4th Generation: NONREACTIVE

## 2014-01-12 LAB — LIPID PANEL
Cholesterol: 186 mg/dL (ref 0–200)
HDL: 49.5 mg/dL (ref >39.00)
LDL Cholesterol: 120 mg/dL — ABNORMAL HIGH (ref 0–99)
NonHDL: 136.5
Total CHOL/HDL Ratio: 4
Triglycerides: 84 mg/dL (ref 0.0–149.0)
VLDL: 16.8 mg/dL (ref 0.0–40.0)

## 2014-01-12 LAB — CBC WITH DIFFERENTIAL/PLATELET
Basophils Absolute: 0 10*3/uL (ref 0.0–0.1)
Basophils Relative: 0.4 % (ref 0.0–3.0)
Eosinophils Absolute: 0.8 10*3/uL — ABNORMAL HIGH (ref 0.0–0.7)
Eosinophils Relative: 10.8 % — ABNORMAL HIGH (ref 0.0–5.0)
HCT: 48.3 % (ref 39.0–52.0)
Hemoglobin: 15.9 g/dL (ref 13.0–17.0)
Lymphocytes Relative: 48.9 % — ABNORMAL HIGH (ref 12.0–46.0)
Lymphs Abs: 3.7 10*3/uL (ref 0.7–4.0)
MCHC: 33 g/dL (ref 30.0–36.0)
MCV: 85.8 fl (ref 78.0–100.0)
Monocytes Absolute: 0.4 10*3/uL (ref 0.1–1.0)
Monocytes Relative: 4.8 % (ref 3.0–12.0)
Neutro Abs: 2.7 10*3/uL (ref 1.4–7.7)
Neutrophils Relative %: 35.1 % — ABNORMAL LOW (ref 43.0–77.0)
Platelets: 286 10*3/uL (ref 150.0–400.0)
RBC: 5.63 Mil/uL (ref 4.22–5.81)
RDW: 13.9 % (ref 11.5–15.5)
WBC: 7.6 10*3/uL (ref 4.0–10.5)

## 2014-01-12 LAB — GC/CHLAMYDIA PROBE AMP, URINE
Chlamydia, Swab/Urine, PCR: NEGATIVE
GC Probe Amp, Urine: NEGATIVE

## 2014-01-12 LAB — TSH: TSH: 0.85 u[IU]/mL (ref 0.35–4.50)

## 2014-01-12 LAB — HEPATIC FUNCTION PANEL
ALT: 34 U/L (ref 0–53)
AST: 26 U/L (ref 0–37)
Albumin: 4.3 g/dL (ref 3.5–5.2)
Alkaline Phosphatase: 54 U/L (ref 39–117)
Bilirubin, Direct: 0.1 mg/dL (ref 0.0–0.3)
Total Bilirubin: 0.6 mg/dL (ref 0.2–1.2)
Total Protein: 7.5 g/dL (ref 6.0–8.3)

## 2014-01-12 LAB — HEPATITIS C ANTIBODY: HCV Ab: REACTIVE — AB

## 2014-01-12 LAB — RPR

## 2014-01-12 LAB — PSA: PSA: 0.58 ng/mL (ref 0.10–4.00)

## 2014-01-12 NOTE — Telephone Encounter (Signed)
Relevant patient education assigned to patient using Emmi. ° °

## 2014-01-15 ENCOUNTER — Other Ambulatory Visit: Payer: Self-pay | Admitting: Family Medicine

## 2014-01-16 DIAGNOSIS — B1920 Unspecified viral hepatitis C without hepatic coma: Secondary | ICD-10-CM

## 2014-01-17 LAB — POCT URINALYSIS DIPSTICK
Bilirubin, UA: NEGATIVE
Blood, UA: NEGATIVE
Glucose, UA: NEGATIVE
Ketones, UA: NEGATIVE
Leukocytes, UA: NEGATIVE
Nitrite, UA: NEGATIVE
Protein, UA: NEGATIVE
Spec Grav, UA: <=1.005
Urobilinogen, UA: 0.2
pH, UA: 5

## 2014-01-22 ENCOUNTER — Ambulatory Visit (INDEPENDENT_AMBULATORY_CARE_PROVIDER_SITE_OTHER): Payer: PRIVATE HEALTH INSURANCE | Admitting: Family Medicine

## 2014-01-22 ENCOUNTER — Encounter: Payer: Self-pay | Admitting: Family Medicine

## 2014-01-22 VITALS — BP 130/72 | HR 75 | Temp 98.5°F | Wt 182.0 lb

## 2014-01-22 DIAGNOSIS — N63 Unspecified lump in unspecified breast: Secondary | ICD-10-CM

## 2014-01-22 NOTE — Progress Notes (Signed)
   Subjective:    Patient ID: Connor Oneal, male    DOB: 1966/03/19, 48 y.o.   MRN: [id number]  HPI Pt here c/o lump L chest -- he noticed last few days. Non tender   Review of Systems As above     Objective:   Physical Exam  BP 130/72  Pulse 75  Temp(Src) 98.5 F (36.9 C) (Oral)  Wt 182 lb (82.555 kg)  SpO2 97% General appearance: alert, cooperative, appears stated age and no distress Chest wall: + mass about 1 in diam Left upper inner chest, nontender      Assessment & Plan:  1. Breast mass in male  - MM Digital Diagnostic Bilat; Future - US BREAST COMPLETE UNI LEFT INC AXILLA; Future

## 2014-01-22 NOTE — Progress Notes (Signed)
Pre visit review using our clinic review tool, if applicable. No additional management support is needed unless otherwise documented below in the visit note. 

## 2014-01-22 NOTE — Patient Instructions (Signed)
A diagnostic mammogram will be scheduled for you They will go over the results while you are there.

## 2014-02-01 ENCOUNTER — Other Ambulatory Visit: Payer: PRIVATE HEALTH INSURANCE

## 2014-02-09 ENCOUNTER — Ambulatory Visit
Admission: RE | Admit: 2014-02-09 | Discharge: 2014-02-09 | Disposition: A | Discharge status: Home / Self Care | Payer: PRIVATE HEALTH INSURANCE | Source: Ambulatory Visit | Attending: Family Medicine | Admitting: Family Medicine

## 2014-02-09 DIAGNOSIS — N63 Unspecified lump in unspecified breast: Secondary | ICD-10-CM

## 2014-03-05 ENCOUNTER — Telehealth: Payer: Self-pay | Admitting: Family Medicine

## 2014-03-05 NOTE — Telephone Encounter (Signed)
Copd on ct from [facility]---  Stop smoking ,  If coughing or sob--- may need inhalers --- and or pulm referral Can have ov to discuss if pt prefers

## 2014-03-05 NOTE — Telephone Encounter (Signed)
Spoke with patient and he stated he has Cxr done and wanted to know what is going to be done. Abnl Cxr and CT [facility] done at [facility] and the patient wants a follow up. I called to have the results faxed over to us.       KP

## 2014-03-05 NOTE — Telephone Encounter (Signed)
Caller name: Ethelene Brownsnthony Relation to pt: Call back number: [phone]   Reason for call:  Pt is inquiring about results they received from [facility] center and something about [facility].

## 2014-03-05 NOTE — Telephone Encounter (Signed)
Received the reports of the CT scan and left in the red folder. Please advise     KP

## 2014-03-06 NOTE — Telephone Encounter (Signed)
MSG left to call the office      KP 

## 2014-03-06 NOTE — Telephone Encounter (Signed)
Pt called back.  -  [phone]  [phone] call after 2:30

## 2014-03-06 NOTE — Telephone Encounter (Signed)
Message left to call the office.    KP 

## 2014-03-06 NOTE — Telephone Encounter (Signed)
Patient has been made aware-- Apt scheduled for Monday at 4 pm.     KP

## 2014-03-12 ENCOUNTER — Ambulatory Visit (INDEPENDENT_AMBULATORY_CARE_PROVIDER_SITE_OTHER): Payer: PRIVATE HEALTH INSURANCE | Admitting: Family Medicine

## 2014-03-12 ENCOUNTER — Encounter: Payer: Self-pay | Admitting: Family Medicine

## 2014-03-12 VITALS — BP 122/80 | HR 75 | Temp 98.3°F | Resp 16 | Wt 182.0 lb

## 2014-03-12 DIAGNOSIS — I10 Essential (primary) hypertension: Secondary | ICD-10-CM

## 2014-03-12 DIAGNOSIS — Z720 Tobacco use: Secondary | ICD-10-CM

## 2014-03-12 DIAGNOSIS — F17200 Nicotine dependence, unspecified, uncomplicated: Secondary | ICD-10-CM

## 2014-03-12 DIAGNOSIS — J449 Chronic obstructive pulmonary disease, unspecified: Secondary | ICD-10-CM

## 2014-03-12 MED ORDER — IPRATROPIUM-ALBUTEROL 20-100 MCG/ACT IN AERS: 1.0000 | INHALATION_SPRAY | Freq: Four times a day (QID) | RESPIRATORY_TRACT | Status: DC

## 2014-03-12 MED ORDER — FLUTICASONE-SALMETEROL 250-50 MCG/DOSE IN AEPB: 1.0000 | INHALATION_SPRAY | Freq: Two times a day (BID) | RESPIRATORY_TRACT | Status: DC

## 2014-03-12 NOTE — Progress Notes (Signed)
   Subjective:    Patient ID: Connor Oneal, male    DOB: 1965/09/08, 48 y.o.   MRN: [id number]  HPI Pt here c/o dry cough that started long before the lisinopril.  Ct/ cxr reviewed with pt. bp better.  No new complaints.   Review of Systems As above    Objective:   Physical Exam BP 122/80  Pulse 75  Temp(Src) 98.3 F (36.8 C) (Oral)  Resp 16  Wt 182 lb (82.555 kg)  SpO2 97% General appearance: alert, cooperative, appears stated age and no distress Neck: no adenopathy, no carotid bruit, no JVD, supple, symmetrical, trachea midline and thyroid not enlarged, symmetric, no tenderness/mass/nodules Lungs: clear to auscultation bilaterally Heart: S1, S2 normal Extremities: extremities normal, atraumatic, no cyanosis or edema        Assessment & Plan:  1. Chronic obstructive pulmonary disease, unspecified COPD, unspecified chronic bronchitis type F/u 3 months or sooner prn, stop smoking - Spirometry with Graph; Standing - Spirometry with Graph - Fluticasone-Salmeterol (ADVAIR DISKUS) 250-50 MCG/DOSE AEPB; Inhale 1 puff into the lungs 2 (two) times daily.  Dispense: 1 each; Refill: 3 - Ipratropium-Albuterol (COMBIVENT RESPIMAT) 20-100 MCG/ACT AERS respimat; Inhale 1 puff into the lungs every 6 (six) hours.  Dispense: 1 Inhaler; Refill: 3

## 2014-03-12 NOTE — Patient Instructions (Signed)

## 2014-03-12 NOTE — Assessment & Plan Note (Signed)
Discussed stopping with pt He states he will stop cold [place]

## 2014-03-12 NOTE — Assessment & Plan Note (Signed)
combivent advair bid-- pt instructed to brush tongue and rinse with water after use each time

## 2014-03-12 NOTE — Assessment & Plan Note (Signed)
con't meds stable 

## 2014-03-12 NOTE — Progress Notes (Signed)
Pre visit review using our clinic review tool, if applicable. No additional management support is needed unless otherwise documented below in the visit note. 

## 2014-04-17 ENCOUNTER — Other Ambulatory Visit: Payer: PRIVATE HEALTH INSURANCE

## 2014-04-23 ENCOUNTER — Other Ambulatory Visit: Payer: PRIVATE HEALTH INSURANCE

## 2014-04-23 DIAGNOSIS — B182 Chronic viral hepatitis C: Secondary | ICD-10-CM

## 2014-04-24 LAB — COMPLETE METABOLIC PANEL WITH GFR
ALT: 21 U/L (ref 0–53)
AST: 16 U/L (ref 0–37)
Albumin: 4.4 g/dL (ref 3.5–5.2)
Alkaline Phosphatase: 51 U/L (ref 39–117)
BUN: 9 mg/dL (ref 6–23)
CO2: 30 mEq/L (ref 19–32)
Calcium: 9.9 mg/dL (ref 8.4–10.5)
Chloride: 101 mEq/L (ref 96–112)
Creat: 1.02 mg/dL (ref 0.50–1.35)
GFR, Est African American: >89 mL/min
GFR, Est Non African American: 87 mL/min
Glucose, Bld: 65 mg/dL — ABNORMAL LOW (ref 70–99)
Potassium: 4.1 mEq/L (ref 3.5–5.3)
Sodium: 139 mEq/L (ref 135–145)
Total Bilirubin: 0.5 mg/dL (ref 0.2–1.2)
Total Protein: 7 g/dL (ref 6.0–8.3)

## 2014-04-24 LAB — HEPATITIS B CORE ANTIBODY, TOTAL: Hep B Core Total Ab: REACTIVE — AB

## 2014-04-24 LAB — ANA: Anti Nuclear Antibody(ANA): NEGATIVE

## 2014-04-24 LAB — IRON: Iron: 123 ug/dL (ref 42–165)

## 2014-04-24 LAB — HEPATITIS B SURFACE ANTIGEN: Hepatitis B Surface Ag: NEGATIVE

## 2014-04-24 LAB — PROTIME-INR
INR: 0.91 (ref <1.50)
Prothrombin Time: 12.3 seconds (ref 11.6–15.2)

## 2014-04-24 LAB — HEPATITIS B SURFACE ANTIBODY,QUALITATIVE: Hep B S Ab: NEGATIVE

## 2014-04-24 LAB — HEPATITIS A ANTIBODY, TOTAL: Hep A Total Ab: REACTIVE — AB

## 2014-04-25 LAB — HCV RNA QUANT RFLX ULTRA OR GENOTYP
HCV Quantitative Log: 6.51 {Log} — ABNORMAL HIGH (ref <1.18)
HCV Quantitative: 3222755 IU/mL — ABNORMAL HIGH (ref <15)

## 2014-04-27 LAB — HEPATITIS C GENOTYPE

## 2014-05-23 ENCOUNTER — Ambulatory Visit (INDEPENDENT_AMBULATORY_CARE_PROVIDER_SITE_OTHER): Payer: PRIVATE HEALTH INSURANCE | Admitting: Internal Medicine

## 2014-05-23 ENCOUNTER — Encounter: Payer: Self-pay | Admitting: Internal Medicine

## 2014-05-23 VITALS — BP 132/84 | HR 76 | Temp 98.4°F | Ht 68.0 in | Wt 181.0 lb

## 2014-05-23 DIAGNOSIS — B182 Chronic viral hepatitis C: Secondary | ICD-10-CM

## 2014-05-23 NOTE — Progress Notes (Signed)
+Connor Oneal is a 48 y.o. male who presents for initial evaluation and management of a positive Hepatitis C antibody test.  Patient tested positive 5 years ago. Hepatitis C risk factors present are: IV drug abuse (details: last used over 5 years ago). Patient denies multiple sexual partners, renal dialysis, sexual contact with person with liver disease, tattoos. Patient has had other studies performed. Results: hepatitis C RNA by PCR, result: positive. Patient has not had prior treatment for Hepatitis C. Patient does not have a past history of liver disease. Patient does not have a family history of liver disease.   HPI: He was evaluated in 2010 including liver biopsy but was F0 and no treatment indicated then.  He has continued to be clean of drugs and alcohol over 6 years and participated in [place].    Patient does have documented immunity to Hepatitis A. Patient does have documented immunity to Hepatitis B.     Review of Systems A comprehensive review of systems was negative.   Past Medical History  Diagnosis Date  . Hepatitis C, chronic   . Irregular heart beat   . GERD (gastroesophageal reflux disease) 08/10/2011  . Migraines     Prior to Admission medications   Medication Sig Start Date End Date Taking? Authorizing Provider  Ascorbic Acid (VITAMIN C) 1000 MG tablet Take 1,000 mg by mouth daily.   Yes Historical Provider, MD  Fluticasone-Salmeterol (ADVAIR DISKUS) 250-50 MCG/DOSE AEPB Inhale 1 puff into the lungs 2 (two) times daily. 03/12/14  Yes Yvonne R Lowne, DO  Ipratropium-Albuterol (COMBIVENT RESPIMAT) 20-100 MCG/ACT AERS respimat Inhale 1 puff into the lungs every 6 (six) hours. 03/12/14  Yes Yvonne R Lowne, DO  lisinopril (PRINIVIL,ZESTRIL) 10 MG tablet Take 1 tablet (10 mg total) by mouth daily. 01/11/14  Yes Lelon PerlaYvonne R Lowne, DO  Multiple Vitamin (MULTIVITAMIN WITH MINERALS) TABS tablet Take 1 tablet by mouth daily.   Yes Historical Provider, MD  Omega-3 Fatty Acids (FISH OIL) 1000  MG CAPS Take 1 capsule by mouth daily.   Yes Historical Provider, MD  vitamin B-12 (CYANOCOBALAMIN) 1000 MCG tablet Take 1,000 mcg by mouth daily.   Yes Historical Provider, MD    No Known Allergies  History  Substance Use Topics  . Smoking status: Current Some Day Smoker -- 1.00 packs/day for 30 years    Types: Cigarettes  . Smokeless tobacco: Never Used  . Alcohol Use: No     Comment: recovering alcoholic - quit drinking 6 years ago    Family History  Problem Relation Age of Onset  . COPD Mother     smoker  . COPD Father     smoker--Lung transplant   No liver disease, no liver cancer   Objective:   Filed Vitals:   05/23/14 1352  BP: 132/84  Pulse: 76  Temp: 98.4 F (36.9 C)   in no apparent distress and alert HEENT: anicteric Cor RRR and No murmurs clear Bowel sounds are normal, liver is not enlarged, spleen is not enlarged peripheral pulses normal, no pedal edema, no clubbing or cyanosis negative for - jaundice, spider hemangioma, telangiectasia, palmar erythema, ecchymosis and atrophy  Laboratory Genotype:  Lab Results  Component Value Date   HCVGENOTYPE 1a 04/23/2014   HCV viral load:  Lab Results  Component Value Date   HCVQUANT [id number]* 04/23/2014   Lab Results  Component Value Date   WBC 7.6 01/11/2014   HGB 15.9 01/11/2014   HCT 48.3 01/11/2014   MCV 85.8 01/11/2014  PLT 286.0 01/11/2014    Lab Results  Component Value Date   CREATININE 1.02 04/23/2014   BUN 9 04/23/2014   NA 139 04/23/2014   K 4.1 04/23/2014   CL 101 04/23/2014   CO2 30 04/23/2014    Lab Results  Component Value Date   ALT 21 04/23/2014   AST 16 04/23/2014   ALKPHOS 51 04/23/2014   BILITOT 0.5 04/23/2014   INR 0.91 04/23/2014      Assessment: Hepatitis C genotype 1a  Plan: 1) Patient counseled extensively on limiting acetaminophen to no more than 2 grams daily, avoidance of alcohol. 2) Transmission discussed with patient including sexual transmission, sharing razors and  toothbrush.   3) Will need referral to gastroenterology if concern for cirrhosis 4) Will need referral for substance abuse counseling: No. 5) Will prescribe Harvoni for 12 weeks once work up complete 6) Hepatitis A vaccine No. 7) Hepatitis B vaccine No. 8) Pneumovax vaccine if concern for cirrhosis 9) will follow up after elastography to go over result.

## 2014-05-23 NOTE — Patient Instructions (Signed)
Date 05/23/2014  Dear Mr. Renette ButtersGolden, As discussed in the ID Clinic, your hepatitis C therapy will include the following medications:          Harvoni 90mg /400mg  tablet:           Take 1 tablet by mouth once daily   Please note that ALL MEDICATIONS WILL START ON THE SAME DATE for a total of 12 weeks. ---------------------------------------------------------------- Your HCV Treatment Start Date: TBA   Your HCV genotype:  1a    Liver Fibrosis: TBD    ---------------------------------------------------------------- YOUR PHARMACY CONTACT:   [facility] [address] Phone: [phone] Hours: Monday to Friday 7:30 am to 6:00 pm   Please always contact your pharmacy at least 3-4 business days before you run out of medications to ensure your next month's medication is ready or 1 week prior to running out if you receive it by mail.  Remember, each prescription is for 28 days. ---------------------------------------------------------------- GENERAL NOTES REGARDING YOUR HEPATITIS C MEDICATION:  SOFOSBUVIR/LEDIPASVIR (HARVONI): - Harvoni tablet is taken daily with OR without food. - The tablets are orange. - The tablets should be stored at room temperature.  - Acid reducing agents such as H2 blockers (ie. Pepcid (famotidine), Zantac (ranitidine), Tagamet (cimetidine), Axid (nizatidine) and proton pump inhibitors (ie. Prilosec (omeprazole), Protonix (pantoprazole), Nexium (esomeprazole), or Aciphex (rabeprazole)) can decrease effectiveness of Harvoni. Do not take until you have discussed with a health care provider.    -Antacids that contain magnesium and/or aluminum hydroxide (ie. Milk of Magensia, Rolaids, Gaviscon, Maalox, Mylanta, an dArthritis Pain Formula)can reduce absorption of Harvoni, so take them at least 4 hours before or after Harvoni.  -Calcium carbonate (calcium supplements or antacids such as Tums, Caltrate,  Os-Cal)needs to be taken at least 4 hours hours before or after Harvoni.  -St. John's wort or any products that contain St. John's wort like some herbal supplements  Please inform the office prior to starting any of these medications.  - The common side effects with Harvoni:      1. Fatigue      2. Headache      3. Nausea      4. Diarrhea      5. Insomnia   Support Path is a suite of resources designed to help patients start with HARVONI and move toward treatment completion GETTING STARTED Support Path helps patients access therapy and get off to an efficient start  Benefits investigation and prior authorization support Co-pay and other financial assistance A specialty pharmacy finder CO-PAY COUPON The HARVONI co-pay coupon may help eligible patients lower their out-of-pocket costs. With a co-pay coupon, most eligible patients may pay no more than $5 per co-pay (restrictions apply) www.harvoni.com call 858 [phone] Not valid for patients enrolled in government healthcare prescription drug programs, such as Medicare Part D and Medicaid. Patients in the coverage gap known as the "donut hole" also are not eligible The HARVONI co-pay coupon program will cover the out-of-pocket costs for HARVONI prescriptions up to a maximum of 25% of the catalog price of a 12-week regimen of HARVONI  Please note that this only lists the most common side effects and is NOT a comprehensive list of the potential side effects of these medications. For more information, please review the drug information sheets that come with your medication package from the pharmacy.  ---------------------------------------------------------------- GENERAL HELPFUL HINTS ON HCV THERAPY: 1. No alcohol. 2. Protect against sun-sensitivity/sunburns (wear sunglasses, hat, long sleeves, pants  and sunscreen). 3. Stay well-hydrated/well-moisturized. 4. Notify the ID Clinic of any changes in your other over-the-counter/herbal or  prescription medications. 5. If you miss a dose of your medication, take the missed dose as soon as you remember. Return to your regular time/dose schedule the next day.  6.  Do not stop taking your medications without first talking with your healthcare provider. 7.  You may take Tylenol (acetaminophen), as long as the dose is less than 2000 mg (OR no more than 4 tablets of the Tylenol Extra Strengths 500mg  tablet) in 24 hours. 8.  You will need to obtain routine labs and/or office visits at [facility] at weeks 2, 4, 8,  and 12 as well as 12 and 24 weeks after completion of treatment.   Scharlene Gloss, [place] for [address] [place], [place]  [phone]

## 2014-06-06 ENCOUNTER — Telehealth: Payer: Self-pay | Admitting: *Deleted

## 2014-06-06 NOTE — Telephone Encounter (Signed)
error 

## 2014-06-06 NOTE — Telephone Encounter (Signed)
[facility] 11/11 8:00 (7:45) NPO 8 hours. Left message with instructions. Andree CossHowell, Victorya Hillman M, RN

## 2014-06-11 ENCOUNTER — Telehealth: Payer: Self-pay | Admitting: *Deleted

## 2014-06-11 NOTE — Telephone Encounter (Signed)
Pt confirmed

## 2014-06-11 NOTE — Telephone Encounter (Signed)
Left message asking patient to call [facility] to confirm his upcoming elastography appointment 11/11 8:00 (7:45 am arrival).  Pt to be  NPO for 6 hours prior to procedure. Andree CossHowell, Hazely Sealey M, RN

## 2014-06-11 NOTE — Telephone Encounter (Signed)
-----   Message from Andree CossMichelle M Helana Macbride, RN sent at 05/23/2014  2:39 PM EDT ----- elastography

## 2014-06-13 ENCOUNTER — Other Ambulatory Visit: Payer: Self-pay | Admitting: Internal Medicine

## 2014-06-13 ENCOUNTER — Ambulatory Visit (HOSPITAL_COMMUNITY)
Admission: RE | Admit: 2014-06-13 | Discharge: 2014-06-13 | Disposition: A | Discharge status: Home / Self Care | Payer: PRIVATE HEALTH INSURANCE | Source: Ambulatory Visit | Attending: Internal Medicine | Admitting: Internal Medicine

## 2014-06-13 DIAGNOSIS — B182 Chronic viral hepatitis C: Secondary | ICD-10-CM | POA: Diagnosis present

## 2014-06-13 MED ORDER — LEDIPASVIR-SOFOSBUVIR 90-400 MG PO TABS: 1.0000 | ORAL_TABLET | Freq: Every day | ORAL | Status: DC

## 2014-06-26 ENCOUNTER — Telehealth: Payer: Self-pay | Admitting: Licensed Clinical Social Worker

## 2014-06-26 NOTE — Telephone Encounter (Signed)
Insurance company [employer]  called stating all they need is the drug and alcohol screen to process Hep C application. Patient is coming in on 12/1 to see Dr. Luciana Axeomer. Insurance company said it will be ok to do it at that time.

## 2014-07-03 ENCOUNTER — Encounter: Payer: Self-pay | Admitting: Internal Medicine

## 2014-07-03 ENCOUNTER — Ambulatory Visit (INDEPENDENT_AMBULATORY_CARE_PROVIDER_SITE_OTHER): Payer: PRIVATE HEALTH INSURANCE | Admitting: Internal Medicine

## 2014-07-03 VITALS — BP 125/84 | HR 79 | Temp 98.5°F | Ht 68.0 in | Wt 180.0 lb

## 2014-07-03 DIAGNOSIS — B182 Chronic viral hepatitis C: Secondary | ICD-10-CM

## 2014-07-03 NOTE — Progress Notes (Signed)
   Subjective:    Patient ID: Connor Oneal, male    DOB: 1965-08-16, 48 y.o.   MRN: [id number]  HPI He comes in for follow-up of hepatitis C. Since his last visit he did have elastography notable for level II, 3 fibrosis. His medication was sent to the pharmacy however the insurance sent us notification that he needs "drug and alcohol screening". No guidance on what that means. No new issues since last visit.   Review of Systems  Constitutional: Negative for fatigue.  Gastrointestinal: Negative for nausea and diarrhea.  Skin: Negative for rash.       Objective:   Physical Exam  Constitutional: He appears well-developed and well-nourished. No distress.  Eyes: No scleral icterus.  Cardiovascular: Normal rate, regular rhythm and normal heart sounds.   No murmur heard. Pulmonary/Chest: Effort normal and breath sounds normal. No respiratory distress.  Lymphadenopathy:    He has no cervical adenopathy.  Skin: No rash noted.          Assessment & Plan:

## 2014-07-03 NOTE — Assessment & Plan Note (Signed)
He has no drug or alcohol use in more than 5 years and this was confirmed in his interview today. I will check a urine drug screen and alcohol level II confirm and send this to the insurance company so he can start his medication. Once he gets the medication he will get labs after 4 weeks.

## 2014-07-04 ENCOUNTER — Telehealth: Payer: Self-pay | Admitting: *Deleted

## 2014-07-04 LAB — DRUG SCREEN, URINE
Amphetamine Screen, Ur: NEGATIVE
Barbiturate Quant, Ur: NEGATIVE
Benzodiazepines.: NEGATIVE
Cocaine Metabolites: NEGATIVE
Creatinine,U: 36.77 mg/dL
Marijuana Metabolite: NEGATIVE
Methadone: NEGATIVE
Opiates: NEGATIVE
Phencyclidine (PCP): NEGATIVE
Propoxyphene: NEGATIVE

## 2014-07-04 LAB — ETHANOL: Alcohol, Ethyl (B): <10 mg/dL (ref 0–10)

## 2014-07-04 NOTE — Telephone Encounter (Signed)
Amy K from [employer] called for results of the requested alcohol/drug screen collected 12/1.  Faxed requested results to [phone], attn Amy K. Andree CossHowell, Fritzie Prioleau M, RN

## 2014-07-17 ENCOUNTER — Other Ambulatory Visit: Payer: Self-pay | Admitting: *Deleted

## 2014-07-17 DIAGNOSIS — B182 Chronic viral hepatitis C: Secondary | ICD-10-CM

## 2014-07-20 ENCOUNTER — Telehealth: Payer: Self-pay | Admitting: *Deleted

## 2014-07-20 DIAGNOSIS — B182 Chronic viral hepatitis C: Secondary | ICD-10-CM

## 2014-07-20 MED ORDER — LEDIPASVIR-SOFOSBUVIR 90-400 MG PO TABS: 1.0000 | ORAL_TABLET | Freq: Every day | ORAL | Status: DC

## 2014-08-01 ENCOUNTER — Telehealth: Payer: Self-pay | Admitting: *Deleted

## 2014-08-01 NOTE — Telephone Encounter (Signed)
Per Park PopeStephen Furr, patient will not start Harvoni until the new year.  He will contact us when the patient picks up the first bottle. Andree CossHowell, Jung Ingerson M, RN

## 2014-08-13 ENCOUNTER — Other Ambulatory Visit: Payer: Self-pay | Admitting: Internal Medicine

## 2014-08-13 DIAGNOSIS — B182 Chronic viral hepatitis C: Secondary | ICD-10-CM

## 2014-08-23 ENCOUNTER — Ambulatory Visit: Payer: PRIVATE HEALTH INSURANCE | Admitting: Pharmacist Clinician (PhC)/ Clinical Pharmacy Specialist

## 2014-08-23 DIAGNOSIS — B182 Chronic viral hepatitis C: Secondary | ICD-10-CM

## 2014-08-23 NOTE — Progress Notes (Signed)
Patient ID: Connor Oneal, male   DOB: 1966-03-18, 49 y.o.   MRN: [id number] HPI: Connor Oneal is a 49 y.o. male who is here for his f/u of hepatitis C  Lab Results  Component Value Date   HCVGENOTYPE 1a 04/23/2014    Allergies: No Known Allergies  Vitals:    Past Medical History: Past Medical History  Diagnosis Date  . Hepatitis C, chronic   . Irregular heart beat   . GERD (gastroesophageal reflux disease) 08/10/2011  . Migraines     Social History: History   Social History  . Marital Status: Single    Spouse Name: N/A    Number of Children: N/A  . Years of Education: N/A   Social History Main Topics  . Smoking status: Current Some Day Smoker -- 1.00 packs/day for 30 years    Types: Cigarettes  . Smokeless tobacco: Never Used  . Alcohol Use: No     Comment: recovering alcoholic - quit drinking 6 years ago  . Drug Use: No     Comment: clean 6 years  . Sexual Activity: Not on file   Other Topics Concern  . Not on file   Social History Narrative    Labs: HEP B S AB (no units)  Date Value  04/23/2014 NEG   HEPATITIS B SURFACE AG (no units)  Date Value  04/23/2014 NEGATIVE   HCV AB (no units)  Date Value  01/11/2014 REACTIVE*    Lab Results  Component Value Date   HCVGENOTYPE 1a 04/23/2014    Hepatitis C RNA quantitative Latest Ref Rng 04/23/2014  HCV Quantitative <15 IU/mL [id number](H)  HCV Quantitative Log <1.18 log 10 6.51(H)    AST (U/L)  Date Value  04/23/2014 16  01/11/2014 26  03/26/2011 23   ALT (U/L)  Date Value  04/23/2014 21  01/11/2014 34  03/26/2011 32   INR (no units)  Date Value  04/23/2014 0.91  03/26/2011 0.87  02/21/2009 0.9    CrCl: CrCl cannot be calculated (Unknown ideal weight.).  Fibrosis Score: F2/3 as assessed by [facility]  Child-Pugh Score: Class A  Previous Treatment Regimen: Naive  Assessment: 49 yo who recently started his Harvoni on 08/10/14. He had some nausea at first but quickly went away. No  issue since then. We discussed all the meds on this profile. The 2 inhalers he doesn't take anymore because of co-pay issue. I reconciled his meds. Discussed with him about all of the potential interactions especially OTC PPIs, antacids, H2 blockers. He will avoid them all. He paid $200 copay for his first month. He has private insurance so he will be able to get copay assistance. His copay will now be $5 for the next 2 months. He has not missed any doses.   Recommendations: Cont Harvoni 1 PO qday F/u labs in 2 wks then Dr. Luciana Axeomer after that  Arlester Markerham, HueyMinh Quang, [place]arm.D., BCPS, AAHIVP Clinical Infectious Disease Pharmacist [facility] 08/23/2014, 4:37 PM

## 2014-09-06 ENCOUNTER — Other Ambulatory Visit: Payer: PRIVATE HEALTH INSURANCE

## 2014-09-06 DIAGNOSIS — B182 Chronic viral hepatitis C: Secondary | ICD-10-CM

## 2014-09-06 LAB — CBC
HCT: 43.9 % (ref 39.0–52.0)
Hemoglobin: 14.8 g/dL (ref 13.0–17.0)
MCH: 28 pg (ref 26.0–34.0)
MCHC: 33.7 g/dL (ref 30.0–36.0)
MCV: 83.1 fL (ref 78.0–100.0)
MPV: 9.4 fL (ref 8.6–12.4)
Platelets: 268 10*3/uL (ref 150–400)
RBC: 5.28 MIL/uL (ref 4.22–5.81)
RDW: 13.5 % (ref 11.5–15.5)
WBC: 8.5 10*3/uL (ref 4.0–10.5)

## 2014-09-06 LAB — COMPREHENSIVE METABOLIC PANEL
ALT: 12 U/L (ref 0–53)
AST: 12 U/L (ref 0–37)
Albumin: 4.2 g/dL (ref 3.5–5.2)
Alkaline Phosphatase: 55 U/L (ref 39–117)
BUN: 7 mg/dL (ref 6–23)
CO2: 28 mEq/L (ref 19–32)
Calcium: 9.6 mg/dL (ref 8.4–10.5)
Chloride: 103 mEq/L (ref 96–112)
Creat: 0.98 mg/dL (ref 0.50–1.35)
Glucose, Bld: 108 mg/dL — ABNORMAL HIGH (ref 70–99)
Potassium: 3.8 mEq/L (ref 3.5–5.3)
Sodium: 139 mEq/L (ref 135–145)
Total Bilirubin: 0.4 mg/dL (ref 0.2–1.2)
Total Protein: 6.9 g/dL (ref 6.0–8.3)

## 2014-09-11 LAB — HEPATITIS C RNA QUANTITATIVE: HCV Quantitative: NOT DETECTED IU/mL (ref <15)

## 2014-09-20 ENCOUNTER — Encounter: Payer: Self-pay | Admitting: Internal Medicine

## 2014-09-20 ENCOUNTER — Ambulatory Visit (INDEPENDENT_AMBULATORY_CARE_PROVIDER_SITE_OTHER): Payer: PRIVATE HEALTH INSURANCE | Admitting: Internal Medicine

## 2014-09-20 VITALS — BP 125/88 | HR 85 | Temp 98.6°F | Wt 180.0 lb

## 2014-09-20 DIAGNOSIS — B182 Chronic viral hepatitis C: Secondary | ICD-10-CM | POA: Diagnosis not present

## 2014-09-21 NOTE — Assessment & Plan Note (Signed)
He is doing well on this treatment and hopefully will be able to continue the 12 weeks. I will schedule him for a lab at the end of treatment and a follow-up appointment if he is able to come to the appointment. He knows that he will need a 3 month after treatment viral load to confirm cure. He will either do that through his jail or return after his release.

## 2014-09-21 NOTE — Progress Notes (Signed)
   Subjective:    Patient ID: Connor Oneal, male    DOB: 01-May-1966, 49 y.o.   MRN: [id number]  HPI He comes in for follow-up of hepatitis C. he started with Harvoni 6 weeks ago and has had no problems. His elastography consistent with fibrosis level 2-3.  He has genotype 1A with an initial viral load of 3 million. He is hepatitis A and B immune. His labs done after 4 weeks of treatment shown undetectable viral load and stable transaminases. He is tolerating well with no issues. Unfortunately, he does tell me he will be going to jail but hopefully will be able to complete his 12 weeks of treatment.   Review of Systems  Constitutional: Negative for fatigue.  Gastrointestinal: Negative for nausea and diarrhea.  Skin: Negative for rash.       Objective:   Physical Exam  Constitutional: He appears well-developed and well-nourished. No distress.  Eyes: No scleral icterus.  Cardiovascular: Normal rate, regular rhythm and normal heart sounds.   No murmur heard. Pulmonary/Chest: Effort normal and breath sounds normal. No respiratory distress.  Lymphadenopathy:    He has no cervical adenopathy.  Skin: No rash noted.          Assessment & Plan:

## 2014-10-31 ENCOUNTER — Other Ambulatory Visit: Payer: PRIVATE HEALTH INSURANCE

## 2014-10-31 DIAGNOSIS — B182 Chronic viral hepatitis C: Secondary | ICD-10-CM

## 2014-10-31 LAB — COMPLETE METABOLIC PANEL WITH GFR
ALT: 11 U/L (ref 0–53)
AST: 12 U/L (ref 0–37)
Albumin: 4.1 g/dL (ref 3.5–5.2)
Alkaline Phosphatase: 54 U/L (ref 39–117)
BUN: 6 mg/dL (ref 6–23)
CO2: 29 mEq/L (ref 19–32)
Calcium: 10 mg/dL (ref 8.4–10.5)
Chloride: 101 mEq/L (ref 96–112)
Creat: 0.85 mg/dL (ref 0.50–1.35)
GFR, Est African American: >89 mL/min
GFR, Est Non African American: >89 mL/min
Glucose, Bld: 118 mg/dL — ABNORMAL HIGH (ref 70–99)
Potassium: 4.1 mEq/L (ref 3.5–5.3)
Sodium: 140 mEq/L (ref 135–145)
Total Bilirubin: 0.3 mg/dL (ref 0.2–1.2)
Total Protein: 7.2 g/dL (ref 6.0–8.3)

## 2014-10-31 LAB — CBC WITH DIFFERENTIAL/PLATELET
Basophils Absolute: 0.1 10*3/uL (ref 0.0–0.1)
Basophils Relative: 1 % (ref 0–1)
Eosinophils Absolute: 0.5 10*3/uL (ref 0.0–0.7)
Eosinophils Relative: 7 % — ABNORMAL HIGH (ref 0–5)
HCT: 43.7 % (ref 39.0–52.0)
Hemoglobin: 14.9 g/dL (ref 13.0–17.0)
Lymphocytes Relative: 37 % (ref 12–46)
Lymphs Abs: 2.7 10*3/uL (ref 0.7–4.0)
MCH: 28.3 pg (ref 26.0–34.0)
MCHC: 34.1 g/dL (ref 30.0–36.0)
MCV: 82.9 fL (ref 78.0–100.0)
MPV: 9.2 fL (ref 8.6–12.4)
Monocytes Absolute: 0.4 10*3/uL (ref 0.1–1.0)
Monocytes Relative: 5 % (ref 3–12)
Neutro Abs: 3.6 10*3/uL (ref 1.7–7.7)
Neutrophils Relative %: 50 % (ref 43–77)
Platelets: 284 10*3/uL (ref 150–400)
RBC: 5.27 MIL/uL (ref 4.22–5.81)
RDW: 13.7 % (ref 11.5–15.5)
WBC: 7.2 10*3/uL (ref 4.0–10.5)

## 2014-11-01 ENCOUNTER — Other Ambulatory Visit: Payer: PRIVATE HEALTH INSURANCE

## 2014-11-05 LAB — HEPATITIS C RNA QUANTITATIVE: HCV Quantitative: NOT DETECTED IU/mL (ref <15)

## 2014-11-13 ENCOUNTER — Ambulatory Visit (INDEPENDENT_AMBULATORY_CARE_PROVIDER_SITE_OTHER): Payer: PRIVATE HEALTH INSURANCE | Admitting: Internal Medicine

## 2014-11-13 ENCOUNTER — Encounter: Payer: Self-pay | Admitting: Internal Medicine

## 2014-11-13 VITALS — BP 105/72 | HR 108 | Temp 98.3°F | Ht 68.0 in | Wt 183.5 lb

## 2014-11-13 DIAGNOSIS — B182 Chronic viral hepatitis C: Secondary | ICD-10-CM | POA: Diagnosis not present

## 2014-11-13 NOTE — Progress Notes (Signed)
   Subjective:    Patient ID: Norton Pastelnthony Lemberger, male    DOB: June 29, 1966, 49 y.o.   MRN: [id number]  HPI He comes in for follow-up of hepatitis C.  he completed Harvoni 3 months ago. His elastography consistent with fibrosis level 2-3.  He has genotype 1A with an initial viral load of 3 million. He is hepatitis A and B immune. His end of treatment viral load was undetectable and now at 3 months posttreatment SVR 12 also is undetectable.   Review of Systems  Constitutional: Negative for fatigue.  Gastrointestinal: Negative for nausea and diarrhea.  Skin: Negative for rash.       Objective:   Physical Exam  Constitutional: He appears well-developed and well-nourished. No distress.  Eyes: No scleral icterus.  Cardiovascular: Normal rate, regular rhythm and normal heart sounds.   No murmur heard. Pulmonary/Chest: Effort normal and breath sounds normal. No respiratory distress.  Lymphadenopathy:    He has no cervical adenopathy.  Skin: No rash noted.          Assessment & Plan:

## 2014-11-13 NOTE — Assessment & Plan Note (Signed)
He has done well and now is considered cured of hepatitis C. He does have a moderate level fibrosis so I would like to recheck his ultrasound with elastography in about one year. He knows to call back in a year to get scheduled. He was pleased with the results.

## 2015-01-10 ENCOUNTER — Telehealth: Payer: Self-pay | Admitting: Family Medicine

## 2015-01-10 DIAGNOSIS — I10 Essential (primary) hypertension: Secondary | ICD-10-CM

## 2015-01-10 MED ORDER — LISINOPRIL 10 MG PO TABS: 10.0000 mg | ORAL_TABLET | Freq: Every day | ORAL | Status: DC

## 2015-01-10 NOTE — Telephone Encounter (Signed)
Apt pending for 6/21--Rx faxed.      KP

## 2015-01-10 NOTE — Telephone Encounter (Signed)
Caller name: Toi Dovell Relationship to patient: self Can be reached: [phone] Pharmacy: [facility] at [facility], [address] [facility]  Reason for call: Pt called for 30 day supply of lisinopril (PRINIVIL,ZESTRIL) 10 MG tablet.

## 2015-01-22 ENCOUNTER — Ambulatory Visit: Payer: PRIVATE HEALTH INSURANCE | Admitting: Family Medicine

## 2017-02-17 ENCOUNTER — Telehealth: Payer: Self-pay | Admitting: *Deleted

## 2017-02-17 NOTE — Telephone Encounter (Signed)
No, I saw him for hep C and he is no longer in my care.  thanks

## 2017-02-17 NOTE — Telephone Encounter (Signed)
Patient walked into clinic. He is asking if Dr Luciana Axeomer would be willing to prescribe 30 days worth of lisinopril 10mg . He states he was released from prison. His appointment to [facility] care [facility] is 8/3, but he will be out of lisinopril 7/22. [facility] was unable to fill the medication for him as he has never been there for care. He states that made him think to ask Dr Luciana Axeomer, as [facility]. Please advise. Andree CossHowell, Jozalyn Baglio M, RN

## 2017-02-22 ENCOUNTER — Ambulatory Visit (HOSPITAL_COMMUNITY)
Admission: EM | Admit: 2017-02-22 | Discharge: 2017-02-22 | Disposition: A | Discharge status: Home / Self Care | Payer: PRIVATE HEALTH INSURANCE

## 2017-03-05 ENCOUNTER — Ambulatory Visit: Payer: Self-pay | Attending: Internal Medicine | Admitting: Internal Medicine

## 2017-03-05 ENCOUNTER — Encounter: Payer: Self-pay | Admitting: Internal Medicine

## 2017-03-05 VITALS — BP 120/90 | HR 78 | Temp 98.4°F | Resp 16 | Wt 193.0 lb

## 2017-03-05 DIAGNOSIS — J449 Chronic obstructive pulmonary disease, unspecified: Secondary | ICD-10-CM | POA: Insufficient documentation

## 2017-03-05 DIAGNOSIS — Z114 Encounter for screening for human immunodeficiency virus [HIV]: Secondary | ICD-10-CM | POA: Insufficient documentation

## 2017-03-05 DIAGNOSIS — F1021 Alcohol dependence, in remission: Secondary | ICD-10-CM | POA: Insufficient documentation

## 2017-03-05 DIAGNOSIS — Z1211 Encounter for screening for malignant neoplasm of colon: Secondary | ICD-10-CM | POA: Insufficient documentation

## 2017-03-05 DIAGNOSIS — B356 Tinea cruris: Secondary | ICD-10-CM | POA: Insufficient documentation

## 2017-03-05 DIAGNOSIS — Z1331 Encounter for screening for depression: Secondary | ICD-10-CM

## 2017-03-05 DIAGNOSIS — B182 Chronic viral hepatitis C: Secondary | ICD-10-CM | POA: Insufficient documentation

## 2017-03-05 DIAGNOSIS — Z836 Family history of other diseases of the respiratory system: Secondary | ICD-10-CM | POA: Insufficient documentation

## 2017-03-05 DIAGNOSIS — F17210 Nicotine dependence, cigarettes, uncomplicated: Secondary | ICD-10-CM | POA: Insufficient documentation

## 2017-03-05 DIAGNOSIS — I10 Essential (primary) hypertension: Secondary | ICD-10-CM | POA: Insufficient documentation

## 2017-03-05 DIAGNOSIS — K219 Gastro-esophageal reflux disease without esophagitis: Secondary | ICD-10-CM | POA: Insufficient documentation

## 2017-03-05 DIAGNOSIS — K029 Dental caries, unspecified: Secondary | ICD-10-CM | POA: Insufficient documentation

## 2017-03-05 DIAGNOSIS — Z8619 Personal history of other infectious and parasitic diseases: Secondary | ICD-10-CM

## 2017-03-05 DIAGNOSIS — Z9889 Other specified postprocedural states: Secondary | ICD-10-CM | POA: Insufficient documentation

## 2017-03-05 DIAGNOSIS — F17200 Nicotine dependence, unspecified, uncomplicated: Secondary | ICD-10-CM

## 2017-03-05 DIAGNOSIS — Z1389 Encounter for screening for other disorder: Secondary | ICD-10-CM | POA: Insufficient documentation

## 2017-03-05 DIAGNOSIS — Z7951 Long term (current) use of inhaled steroids: Secondary | ICD-10-CM | POA: Insufficient documentation

## 2017-03-05 MED ORDER — VARENICLINE TARTRATE 1 MG PO TABS: 1.0000 mg | ORAL_TABLET | Freq: Two times a day (BID) | ORAL | 1 refills | Status: DC

## 2017-03-05 MED ORDER — ALBUTEROL SULFATE HFA 108 (90 BASE) MCG/ACT IN AERS: 2.0000 | INHALATION_SPRAY | Freq: Four times a day (QID) | RESPIRATORY_TRACT | 6 refills | Status: DC | PRN

## 2017-03-05 MED ORDER — FLUTICASONE-SALMETEROL 100-50 MCG/DOSE IN AEPB: 1.0000 | INHALATION_SPRAY | Freq: Two times a day (BID) | RESPIRATORY_TRACT | 3 refills | Status: DC

## 2017-03-05 MED ORDER — LISINOPRIL 5 MG PO TABS: 5.0000 mg | ORAL_TABLET | Freq: Every day | ORAL | 2 refills | Status: DC

## 2017-03-05 MED ORDER — VARENICLINE TARTRATE 0.5 MG PO TABS: 0.5000 mg | ORAL_TABLET | Freq: Two times a day (BID) | ORAL | 0 refills | Status: DC

## 2017-03-05 MED FILL — ?LISINOPRIL 5 MG TABLET: 5 | 30 days supply | Qty: 30 | Fill #0 | Status: TO

## 2017-03-05 MED FILL — !ADVAIR 100/50 DISKUS: 100-50 | 30 days supply | Qty: 60 | Fill #0

## 2017-03-05 MED FILL — !VENTOLIN HFA INHALER: 108 (90 BAS | 25 days supply | Qty: 18 | Fill #0 | Status: TO

## 2017-03-05 NOTE — Progress Notes (Signed)
Patient ID: Connor Oneal, male    DOB: [birth year]  MRN: [id number]  CC: New Patient (Initial Visit) and Hypertension   Subjective: Connor Oneal is a 51 y.o. male who presents to est care with me as PCP. Use to be seen at [facility].  His concerns are:  50 year old male with history of hepatitis C that has been cured with Harvoni as of 2016, COPD, tobacco dependence and HTN. 1.  Hep C cured: -last saw ID 2016 and was told to f/u in 1 yr for fibroscan but pt was in prison for past 2 yrs; released 2 mths ago. -no abdominal pain/N/V -would like hep C quat checked to make sure no relapse  2. HTN -was on Lis/HCTZ 20/25 in prison. Out of meds x 2 mths. Was using some of his friend's Lisinopril recently. Last took 5 days ago -needs to do better with limiting salt. + SOB "behind cigarettes.". NO CP or LE edema  3. Tob dep: smoked since age [age] yrs 1 pk a day -quit for 11 mths while in prison -would like to quit -would like to try Chantix.  + Depression screen today but pt states this is not a major issue for him  4.  COPD: was on Albuterol and Advair. Out x 2 mths -+ dry cough.   Patient Active Problem List   Diagnosis Date Noted  . HTN (hypertension) 03/12/2014  . CAFL (chronic airflow limitation) (HCC) 09/26/2013  . Abnormal chest x-ray 09/25/2013  . Chronic hepatitis C (HCC) 09/25/2013  . Tobacco abuse 09/25/2011  . COPD (chronic obstructive pulmonary disease) (HCC) 09/08/2011  . Tinea cruris 08/10/2011  . GERD (gastroesophageal reflux disease) 08/10/2011  . Dental caries 05/14/2011  . Fatigue 05/14/2011  . Hepatitis C, chronic (HCC) 04/22/2011  . Alcoholism in remission (HCC) 04/22/2011     Current Outpatient Prescriptions on File Prior to Visit  Medication Sig Dispense Refill  . Ascorbic Acid (VITAMIN C) 1000 MG tablet Take 1,000 mg by mouth daily.    Marland Kitchen lisinopril (PRINIVIL,ZESTRIL) 10 MG tablet Take 1 tablet (10 mg total) by mouth daily. 90 tablet 0  . Multiple  Vitamin (MULTIVITAMIN WITH MINERALS) TABS tablet Take 1 tablet by mouth daily.    . Omega-3 Fatty Acids (FISH OIL) 1000 MG CAPS Take 1 capsule by mouth daily.    . vitamin B-12 (CYANOCOBALAMIN) 1000 MCG tablet Take 1,000 mcg by mouth daily.     No current facility-administered medications on file prior to visit.     No Known Allergies  Social History   Social History  . Marital status: Single    Spouse name: N/A  . Number of children: N/A  . Years of education: N/A   Occupational History  . Not on file.   Social History Main Topics  . Smoking status: Current Every Day Smoker    Packs/day: 1.00    Years: 30.00    Types: Cigarettes  . Smokeless tobacco: Never Used  . Alcohol use No     Comment: recovering alcoholic - quit drinking 6 years ago  . Drug use: No     Comment: clean 6 years  . Sexual activity: Yes   Other Topics Concern  . Not on file   Social History Narrative  . No narrative on file    Family History  Problem Relation Age of Onset  . COPD Mother        smoker  . COPD Father        smoker--Lung  transplant    Past Surgical History:  Procedure Laterality Date  . HAND SURGERY      ROS: Review of Systems negative except as stated above PHYSICAL EXAM: BP (!) 145/97   Pulse 78   Temp 98.4 F (36.9 C) (Oral)   Resp 16   Wt 193 lb (87.5 kg)   SpO2 99%   BMI 29.35 kg/m   120/90 LT and 124/90  Physical Exam General appearance - alert, well appearing, middle age AAM and in no distress Mental status - alert, oriented to person, place, and time, normal mood, behavior, speech, dress, motor activity, and thought processes Eyes - pupils equal and reactive, extraocular eye movements intact Nose - normal and patent, no erythema, discharge or polyps Mouth - mucous membranes moist, pharynx normal without lesions Neck - supple, no significant adenopathy Chest - clear to auscultation, no wheezes, rales or rhonchi, symmetric air entry Heart - normal rate,  regular rhythm, normal S1, S2, no murmurs, rubs, clicks or gallops Abdomen - soft, nontender, nondistended, no masses or organomegaly Extremities - peripheral pulses normal, no pedal edema, no clubbing or cyanosis  Depression screen PHQ 2/9 03/05/2017  Decreased Interest 1  Down, Depressed, Hopeless 1  PHQ - 2 Score 2  Altered sleeping 1  Tired, decreased energy 1  Change in appetite 0  Feeling bad or failure about yourself  1  Trouble concentrating 0  Moving slowly or fidgety/restless 0  Suicidal thoughts 0  PHQ-9 Score 5   GAD 7 : Generalized Anxiety Score 03/05/2017  Nervous, Anxious, on Edge 0  Control/stop worrying 1  Worry too much - different things 1  Trouble relaxing 0  Restless 0  Easily annoyed or irritable 1  Afraid - awful might happen 0  Total GAD 7 Score 3   ASSESSMENT AND PLAN:  1. Essential hypertension -improved off meds for several wks.  He was on Lis/HCTZ 20/25.  Given numbers today, will start only on Lisinopril 5 mg daily and have him see RN in 2 wks for recheck. DASH discussed - CBC - Comprehensive metabolic panel - Lipid panel - lisinopril (PRINIVIL,ZESTRIL) 5 MG tablet; Take 1 tablet (5 mg total) by mouth daily.  Dispense: 30 tablet; Refill: 2  2. Hepatitis C virus infection cured after antiviral drug therapy -pt request recheck for relapse. [facility], can refer to Dr. Luciana Axeomer for liver scan - Hepatitis C RNA quantitative  3. Tobacco dependence Patient advised to quit smoking. Discussed health risks associated with smoking including lung and other types of cancers, chronic lung diseases and CV risks.. Pt ready to give trail of quitting.  Discussed methods to help quit including quitting cold [place], use of NRT, Chantix and Bupropion.   Wants to try Chantix.  Went over possible S.E including mood swings and bad dreams.  Stop med and call if this occurs. - varenicline (CHANTIX) 0.5 MG tablet; Take 1 tablet (0.5 mg total) by mouth 2 (two)  times daily.  Dispense: 60 tablet; Refill: 0 - varenicline (CHANTIX CONTINUING MONTH PAK) 1 MG tablet; Take 1 tablet (1 mg total) by mouth 2 (two) times daily.  Dispense: 60 tablet; Refill: 1  4. Colon cancer screening - Fecal occult blood, imunochemical  5. Chronic obstructive pulmonary disease, unspecified COPD type (HCC) - albuterol (PROVENTIL HFA;VENTOLIN HFA) 108 (90 Base) MCG/ACT inhaler; Inhale 2 puffs into the lungs every 6 (six) hours as needed for wheezing or shortness of breath.  Dispense: 1 Inhaler; Refill: 6 -  Fluticasone-Salmeterol (ADVAIR) 100-50 MCG/DOSE AEPB; Inhale 1 puff into the lungs 2 (two) times daily.  Dispense: 1 each; Refill: 3  6. Screening for HIV (human immunodeficiency virus) - HIV antibody  7. Positive depression screening Pt reports good support.  Not a major issue    the opportunity to ask questions.  Patient verbalized understanding of the plan and was able to repeat key elements of the plan.   No orders of the defined types were placed in this encounter.    Requested Prescriptions    No prescriptions requested or ordered in this encounter    No Follow-up on file.  Jonah Blueeborah Lacosta Hargan, MD, FACP

## 2017-03-05 NOTE — Patient Instructions (Addendum)
Please give forms for [employer] [facility].  Give two weeks follow up with Travia for blood pressure check.   Try to limit salt in your the foods you eat as discussed today.  Once you have qualified [facility], please let me know so that we can refer you back to Dr. Luciana Axeomer for the liver scan.   Varenicline oral tablets What is this medicine? VARENICLINE (var EN i kleen) is used to help people quit smoking. It can reduce the symptoms caused by stopping smoking. It is used with a patient support program recommended by your physician. This medicine may be used for other purposes; ask your health care provider or pharmacist if you have questions. COMMON BRAND NAME(S): Chantix What should I tell my health care provider before I take this medicine? They need to know if you have any of these conditions: -bipolar disorder, depression, schizophrenia or other mental illness -heart disease -if you often drink alcohol -kidney disease -peripheral vascular disease -seizures -stroke -suicidal thoughts, plans, or attempt; a previous suicide attempt by you or a family member -an unusual or allergic reaction to varenicline, other medicines, foods, dyes, or preservatives -pregnant or trying to get pregnant -breast-feeding How should I use this medicine? Take this medicine by mouth after eating. Take with a full glass of water. Follow the directions on the prescription label. Take your doses at regular intervals. Do not take your medicine more often than directed. There are 3 ways you can use this medicine to help you quit smoking; talk to your health care professional to decide which plan is right for you: 1) you can choose a quit date and start this medicine 1 week before the quit date, or, 2) you can start taking this medicine before you choose a quit date, and then pick a quit date between day 8 and 35 days of treatment, or, 3) if you are not sure that you are able or  willing to quit smoking right away, start taking this medicine and slowly decrease the amount you smoke as directed by your health care professional with the goal of being cigarette-free by week 12 of treatment. Stick to your plan; ask about support groups or other ways to help you remain cigarette-free. If you are motivated to quit smoking and did not succeed during a previous attempt with this medicine for reasons other than side effects, or if you returned to smoking after this treatment, speak with your health care professional about whether another course of this medicine may be right for you. A special MedGuide will be given to you by the pharmacist with each prescription and refill. Be sure to read this information carefully each time. Talk to your pediatrician regarding the use of this medicine in children. This medicine is not approved for use in children. Overdosage: If you think you have taken too much of this medicine contact a poison control center or emergency room at once. NOTE: This medicine is only for you. Do not share this medicine with others. What if I miss a dose? If you miss a dose, take it as soon as you can. If it is almost time for your next dose, take only that dose. Do not take double or extra doses. What may interact with this medicine? -alcohol or any product that contains alcohol -insulin -other stop smoking aids -theophylline -warfarin This list may not describe all possible interactions. Give your health care provider a list of all the medicines, herbs,  non-prescription drugs, or dietary supplements you use. Also tell them if you smoke, drink alcohol, or use illegal drugs. Some items may interact with your medicine. What should I watch for while using this medicine? Visit your doctor or health care professional for regular check ups. Ask for ongoing advice and encouragement from your doctor or healthcare professional, friends, and family to help you quit. If you smoke  while on this medication, quit again Your mouth may get dry. Chewing sugarless gum or sucking hard candy, and drinking plenty of water may help. Contact your doctor if the problem does not go away or is severe. You may get drowsy or dizzy. Do not drive, use machinery, or do anything that needs mental alertness until you know how this medicine affects you. Do not stand or sit up quickly, especially if you are an older patient. This reduces the risk of dizzy or fainting spells. Sleepwalking can happen during treatment with this medicine, and can sometimes lead to behavior that is harmful to you, other people, or property. Stop taking this medicine and tell your doctor if you start sleepwalking or have other unusual sleep-related activity. Decrease the amount of alcoholic beverages that you drink during treatment with this medicine until you know if this medicine affects your ability to tolerate alcohol. Some people have experienced increased drunkenness (intoxication), unusual or sometimes aggressive behavior, or no memory of things that have happened (amnesia) during treatment with this medicine. The use of this medicine may increase the chance of suicidal thoughts or actions. Pay special attention to how you are responding while on this medicine. Any worsening of mood, or thoughts of suicide or dying should be reported to your health care professional right away. What side effects may I notice from receiving this medicine? Side effects that you should report to your doctor or health care professional as soon as possible: -allergic reactions like skin rash, itching or hives, swelling of the face, lips, tongue, or throat -acting aggressive, being angry or violent, or acting on dangerous impulses -breathing problems -changes in vision -chest pain or chest tightness -confusion, trouble speaking or understanding -new or worsening depression, anxiety, or panic attacks -extreme increase in activity and  talking (mania) -fast, irregular heartbeat -feeling faint or lightheaded, falls -fever -pain in legs when walking -problems with balance, talking, walking -redness, blistering, peeling or loosening of the skin, including inside the mouth -ringing in ears -seeing or hearing things that aren't there (hallucinations) -seizures -sleepwalking -sudden numbness or weakness of the face, arm or leg -thoughts about suicide or dying, or attempts to commit suicide -trouble passing urine or change in the amount of urine -unusual bleeding or bruising -unusually weak or tired Side effects that usually do not require medical attention (report to your doctor or health care professional if they continue or are bothersome): -constipation -headache -nausea, vomiting -strange dreams -stomach gas -trouble sleeping This list may not describe all possible side effects. Call your doctor for medical advice about side effects. You may report side effects to FDA at 1-800-FDA-1088. Where should I keep my medicine? Keep out of the reach of children. Store at room temperature between 15 and 30 degrees C (59 and 86 degrees F). Throw away any unused medicine after the expiration date. NOTE: This sheet is a summary. It may not cover all possible information. If you have questions about this medicine, talk to your doctor, pharmacist, or health care provider.  2018 Elsevier/Gold Standard (2015-04-04 16:14:23)

## 2017-03-06 LAB — COMPREHENSIVE METABOLIC PANEL
ALT: 10 IU/L (ref 0–44)
AST: 13 IU/L (ref 0–40)
Albumin/Globulin Ratio: 1.6 (ref 1.2–2.2)
Albumin: 4.2 g/dL (ref 3.5–5.5)
Alkaline Phosphatase: 50 IU/L (ref 39–117)
BUN/Creatinine Ratio: 8 — ABNORMAL LOW (ref 9–20)
BUN: 7 mg/dL (ref 6–24)
Bilirubin Total: <0.2 mg/dL (ref 0.0–1.2)
CO2: 22 mmol/L (ref 20–29)
Calcium: 9.6 mg/dL (ref 8.7–10.2)
Chloride: 107 mmol/L — ABNORMAL HIGH (ref 96–106)
Creatinine, Ser: 0.85 mg/dL (ref 0.76–1.27)
GFR calc Af Amer: 117 mL/min/{1.73_m2} (ref >59)
GFR calc non Af Amer: 101 mL/min/{1.73_m2} (ref >59)
Globulin, Total: 2.7 g/dL (ref 1.5–4.5)
Glucose: 89 mg/dL (ref 65–99)
Potassium: 4.5 mmol/L (ref 3.5–5.2)
Sodium: 144 mmol/L (ref 134–144)
Total Protein: 6.9 g/dL (ref 6.0–8.5)

## 2017-03-06 LAB — HIV ANTIBODY (ROUTINE TESTING W REFLEX): HIV Screen 4th Generation wRfx: NONREACTIVE

## 2017-03-06 LAB — CBC
Hematocrit: 42.6 % (ref 37.5–51.0)
Hemoglobin: 14.6 g/dL (ref 13.0–17.7)
MCH: 28.7 pg (ref 26.6–33.0)
MCHC: 34.3 g/dL (ref 31.5–35.7)
MCV: 84 fL (ref 79–97)
Platelets: 290 10*3/uL (ref 150–379)
RBC: 5.08 x10E6/uL (ref 4.14–5.80)
RDW: 14.3 % (ref 12.3–15.4)
WBC: 7.5 10*3/uL (ref 3.4–10.8)

## 2017-03-06 LAB — LIPID PANEL
Chol/HDL Ratio: 4.2 ratio (ref 0.0–5.0)
Cholesterol, Total: 196 mg/dL (ref 100–199)
HDL: 47 mg/dL (ref >39)
LDL Calculated: 132 mg/dL — ABNORMAL HIGH (ref 0–99)
Triglycerides: 86 mg/dL (ref 0–149)
VLDL Cholesterol Cal: 17 mg/dL (ref 5–40)

## 2017-03-06 LAB — COMMENT2 - HEP PANEL

## 2017-03-06 LAB — HEPATITIS C ANTIBODY (REFLEX): HCV Ab: >11 s/co ratio — ABNORMAL HIGH (ref 0.0–0.9)

## 2017-03-06 NOTE — Addendum Note (Signed)
Addended by: Jonah BlueJOHNSON, Dejah Droessler B on: 03/06/2017 02:08 PM   Modules accepted: Orders

## 2017-03-06 NOTE — Progress Notes (Signed)
Labs results reviewed. HepC quant test looks like it was accidentally cancelled by our lab and Hep C antibody ordered instead.  I called [facility] today and added HepC quant (lab [id number]) as intended.  Fax sent to [phone] for me to sign off.

## 2017-03-08 LAB — SPECIMEN STATUS REPORT

## 2017-03-10 ENCOUNTER — Other Ambulatory Visit: Payer: Self-pay | Admitting: Internal Medicine

## 2017-03-10 ENCOUNTER — Encounter: Payer: Self-pay | Admitting: Internal Medicine

## 2017-03-10 DIAGNOSIS — E785 Hyperlipidemia, unspecified: Secondary | ICD-10-CM

## 2017-03-10 HISTORY — DX: Hyperlipidemia, unspecified: E78.5

## 2017-03-10 LAB — HCV RNA QUANT: Hepatitis C Quantitation: NOT DETECTED IU/mL

## 2017-03-10 LAB — SPECIMEN STATUS REPORT

## 2017-03-10 MED ORDER — ATORVASTATIN CALCIUM 10 MG PO TABS: 10.0000 mg | ORAL_TABLET | Freq: Every day | ORAL | 5 refills | Status: DC

## 2017-03-10 MED FILL — ?ATORVASTATIN 10 MG TABLET: 10 | 30 days supply | Qty: 30 | Fill #0 | Status: TO

## 2017-03-10 NOTE — Progress Notes (Signed)
lipitor

## 2017-03-11 ENCOUNTER — Telehealth: Payer: Self-pay

## 2017-03-11 ENCOUNTER — Telehealth: Payer: Self-pay | Admitting: Internal Medicine

## 2017-03-11 LAB — FECAL OCCULT BLOOD, IMMUNOCHEMICAL: Fecal Occult Bld: NEGATIVE

## 2017-03-11 MED ORDER — AMLODIPINE BESYLATE 5 MG PO TABS: 5.0000 mg | ORAL_TABLET | Freq: Every day | ORAL | 3 refills | Status: DC

## 2017-03-11 NOTE — Telephone Encounter (Signed)
Contacted pt to go over lab results pt is aware of results and doesn't have any questions or concerns 

## 2017-03-11 NOTE — Telephone Encounter (Signed)
Will have pt stop Lisinopril and change to Norvasc.

## 2017-03-11 NOTE — Telephone Encounter (Signed)
Will route to nurse °

## 2017-03-11 NOTE — Telephone Encounter (Signed)
Will forward to pcp

## 2017-03-11 NOTE — Telephone Encounter (Signed)
Patient came in today stated blood pressure medication is not working and it causing him to get headaches. Declined appt at this time.  Would like for nurse to call him back ph: [phone]

## 2017-03-12 MED FILL — AMLODIPINE BESYLATE 5 MG TA: 5 | 30 days supply | Qty: 30 | Fill #0

## 2017-03-12 NOTE — Telephone Encounter (Signed)
Contacted pt to make aware of bp medication change pt didn't answer lvm informing him of this information and if he has any questions or concerns to give us a call

## 2017-03-19 ENCOUNTER — Ambulatory Visit: Payer: Self-pay | Attending: Internal Medicine | Admitting: *Deleted

## 2017-03-19 VITALS — BP 138/90 | HR 95

## 2017-03-19 DIAGNOSIS — Z0130 Encounter for examination of blood pressure without abnormal findings: Secondary | ICD-10-CM

## 2017-03-19 DIAGNOSIS — I10 Essential (primary) hypertension: Secondary | ICD-10-CM | POA: Insufficient documentation

## 2017-03-19 NOTE — Progress Notes (Signed)
Pt arrived to [facility],  alert and oriented. Pt appears and states he is very sleepy and tired. He works 3rd shift. Last OV  03/05/2017 with Dr.Johnson.   Pt denies chest pain, SOB, HA, dizziness, or blurred vision.  Verified medication and pt states medication was taken this morning.   Manual blood pressure reading:  138/90 in both arms.   Will route nurse visit to PCP for further recommendations on BP and medication. Pt advised to continue current dose until further notice by PCP.

## 2017-04-02 ENCOUNTER — Other Ambulatory Visit: Payer: Self-pay

## 2017-04-02 DIAGNOSIS — J449 Chronic obstructive pulmonary disease, unspecified: Secondary | ICD-10-CM

## 2017-04-02 MED ORDER — ALBUTEROL SULFATE HFA 108 (90 BASE) MCG/ACT IN AERS: 2.0000 | INHALATION_SPRAY | Freq: Four times a day (QID) | RESPIRATORY_TRACT | 3 refills | Status: DC | PRN

## 2017-04-02 MED ORDER — FLUTICASONE-SALMETEROL 100-50 MCG/DOSE IN AEPB: 1.0000 | INHALATION_SPRAY | Freq: Two times a day (BID) | RESPIRATORY_TRACT | 3 refills | Status: DC

## 2017-04-06 MED FILL — AMLODIPINE BESYLATE 5 MG TA: 5 | 30 days supply | Qty: 30 | Fill #1

## 2017-04-27 ENCOUNTER — Other Ambulatory Visit: Payer: Self-pay

## 2017-04-27 DIAGNOSIS — F17200 Nicotine dependence, unspecified, uncomplicated: Secondary | ICD-10-CM

## 2017-04-27 MED ORDER — VARENICLINE TARTRATE 0.5 MG PO TABS: 0.5000 mg | ORAL_TABLET | Freq: Two times a day (BID) | ORAL | 0 refills | Status: DC

## 2017-04-27 MED ORDER — VARENICLINE TARTRATE 1 MG PO TABS: 1.0000 mg | ORAL_TABLET | Freq: Two times a day (BID) | ORAL | 0 refills | Status: DC

## 2017-05-04 ENCOUNTER — Telehealth: Payer: Self-pay | Admitting: Internal Medicine

## 2017-05-04 DIAGNOSIS — F17200 Nicotine dependence, unspecified, uncomplicated: Secondary | ICD-10-CM

## 2017-05-04 MED ORDER — VARENICLINE TARTRATE 1 MG PO TABS: 1.0000 mg | ORAL_TABLET | Freq: Two times a day (BID) | ORAL | 0 refills | Status: DC

## 2017-05-04 NOTE — Telephone Encounter (Signed)
Pt called to request a refill for varenicline (CHANTIX CONTINUING MONTH PAK) 1 MG tablet  varenicline (CHANTIX) 0.5 MG tablet   Please follow up

## 2017-05-04 NOTE — Telephone Encounter (Signed)
Will forward to Dr. Laural Benes. Patient should just need continuing month pak

## 2017-05-05 NOTE — Telephone Encounter (Signed)
Contacted pt to make aware rx is ready for pick up pt didn't answer lvm regarding information

## 2017-05-12 MED FILL — AMLODIPINE BESYLATE 5 MG TA: 5 | 30 days supply | Qty: 30 | Fill #2

## 2017-05-18 MED FILL — $CHANTIX 1 MG CONT MONTH BO: 1 | 56 days supply | Qty: 112 | Fill #0

## 2017-05-18 MED FILL — $CHANTIX STARTING MONTH BOX: 0.5 MG X 11 | 28 days supply | Qty: 53 | Fill #0

## 2017-06-07 ENCOUNTER — Ambulatory Visit: Payer: PRIVATE HEALTH INSURANCE | Admitting: Internal Medicine

## 2017-06-11 MED FILL — AMLODIPINE BESYLATE 5 MG TA: 5 | 30 days supply | Qty: 30 | Fill #3

## 2017-07-09 ENCOUNTER — Telehealth: Payer: Self-pay | Admitting: Internal Medicine

## 2017-07-09 ENCOUNTER — Other Ambulatory Visit: Payer: Self-pay | Admitting: Pharmacist

## 2017-07-09 MED ORDER — AMLODIPINE BESYLATE 5 MG PO TABS: 5.0000 mg | ORAL_TABLET | Freq: Every day | ORAL | 0 refills | Status: DC

## 2017-07-09 MED ORDER — ATORVASTATIN CALCIUM 10 MG PO TABS: 10.0000 mg | ORAL_TABLET | Freq: Every day | ORAL | 0 refills | Status: DC

## 2017-07-09 MED FILL — AMLODIPINE BESYLATE 5 MG TA: 5 | 30 days supply | Qty: 30 | Fill #0

## 2017-07-09 MED FILL — ?ATORVASTATIN 10 MG TABLET: 10 | 30 days supply | Qty: 30 | Fill #0

## 2017-07-09 NOTE — Telephone Encounter (Signed)
Refilled x 30 days, need OV with PCP

## 2017-07-09 NOTE — Telephone Encounter (Signed)
Patient cam in asking for bp medication refill.

## 2017-08-06 ENCOUNTER — Encounter: Payer: Self-pay | Admitting: Internal Medicine

## 2017-08-06 ENCOUNTER — Ambulatory Visit: Payer: 59 | Attending: Internal Medicine | Admitting: Internal Medicine

## 2017-08-06 VITALS — BP 122/81 | HR 78 | Temp 98.9°F | Resp 16 | Wt 194.0 lb

## 2017-08-06 DIAGNOSIS — Z7951 Long term (current) use of inhaled steroids: Secondary | ICD-10-CM | POA: Insufficient documentation

## 2017-08-06 DIAGNOSIS — Z79899 Other long term (current) drug therapy: Secondary | ICD-10-CM | POA: Insufficient documentation

## 2017-08-06 DIAGNOSIS — Z8619 Personal history of other infectious and parasitic diseases: Secondary | ICD-10-CM | POA: Diagnosis not present

## 2017-08-06 DIAGNOSIS — F1020 Alcohol dependence, uncomplicated: Secondary | ICD-10-CM | POA: Diagnosis not present

## 2017-08-06 DIAGNOSIS — B1920 Unspecified viral hepatitis C without hepatic coma: Secondary | ICD-10-CM | POA: Insufficient documentation

## 2017-08-06 DIAGNOSIS — I10 Essential (primary) hypertension: Secondary | ICD-10-CM | POA: Diagnosis present

## 2017-08-06 DIAGNOSIS — E785 Hyperlipidemia, unspecified: Secondary | ICD-10-CM | POA: Diagnosis not present

## 2017-08-06 DIAGNOSIS — Z9889 Other specified postprocedural states: Secondary | ICD-10-CM | POA: Insufficient documentation

## 2017-08-06 DIAGNOSIS — J449 Chronic obstructive pulmonary disease, unspecified: Secondary | ICD-10-CM | POA: Diagnosis not present

## 2017-08-06 DIAGNOSIS — F17200 Nicotine dependence, unspecified, uncomplicated: Secondary | ICD-10-CM | POA: Diagnosis not present

## 2017-08-06 DIAGNOSIS — F17210 Nicotine dependence, cigarettes, uncomplicated: Secondary | ICD-10-CM | POA: Insufficient documentation

## 2017-08-06 DIAGNOSIS — Z825 Family history of asthma and other chronic lower respiratory diseases: Secondary | ICD-10-CM | POA: Diagnosis not present

## 2017-08-06 MED ORDER — ALBUTEROL SULFATE HFA 108 (90 BASE) MCG/ACT IN AERS: 2.0000 | INHALATION_SPRAY | Freq: Four times a day (QID) | RESPIRATORY_TRACT | 5 refills | Status: DC | PRN

## 2017-08-06 MED ORDER — ATORVASTATIN CALCIUM 10 MG PO TABS: 10.0000 mg | ORAL_TABLET | Freq: Every day | ORAL | 5 refills | Status: DC

## 2017-08-06 MED ORDER — AMLODIPINE BESYLATE 5 MG PO TABS: 5.0000 mg | ORAL_TABLET | Freq: Every day | ORAL | 5 refills | Status: DC

## 2017-08-06 MED ORDER — FLUTICASONE-SALMETEROL 100-50 MCG/DOSE IN AEPB: 1.0000 | INHALATION_SPRAY | Freq: Two times a day (BID) | RESPIRATORY_TRACT | 5 refills | Status: DC

## 2017-08-06 MED FILL — $ADVAIR 100/50 MCG INHALER: 100-50 | 30 days supply | Qty: 60 | Fill #0 | Status: TO

## 2017-08-06 MED FILL — $VENTOLIN HFA 18G INHALER: 108 (90 BAS | 25 days supply | Qty: 18 | Fill #0 | Status: TO

## 2017-08-06 MED FILL — ?ATORVASTATIN 10 MG TABLET: 10 | 30 days supply | Qty: 30 | Fill #0 | Status: TO

## 2017-08-06 NOTE — Patient Instructions (Signed)
Continue to work on trying to cut back on smoking.  Continue the Amlodipine, Atorvastatin and your inhalers.   Follow up in 4-6 months if you chose to.

## 2017-08-06 NOTE — Progress Notes (Signed)
Patient ID: Connor Oneal, male    DOB: [birth year]  MRN: [id number]  CC: Hypertension   Subjective: Connor Pastelnthony Erno is a 52 y.o. male who presents for chronic ds management. Last seen 03/2017 His concerns today include: 52 year old male with history of hepatitis C that has been cured with Harvoni as of 2016, COPD, tobacco dependence and HTN.  Patient upset and hostile this a.m.  He is upset that he received 2 bills from our office after his last visit.  States that he will probably change to a different provider outside of our system.    1.  HTN: -compliant with Norvasc No LE edema/CP. Some SOB  2. Hep C: RNA remains undetected.  3 Tob: using Chantix but "they not working."  Still smoking a pack a day. Not sure what it will take to get him to quit  4.  HL:  Did fill the rxn for Lipitor as recommended based on lipid profile on last visit  Patient Active Problem List   Diagnosis Date Noted  . Hyperlipidemia 03/10/2017  . HTN (hypertension) 03/12/2014  . Tobacco abuse 09/25/2011  . COPD (chronic obstructive pulmonary disease) (HCC) 09/08/2011  . Hepatitis C virus infection cured after antiviral drug therapy 04/22/2011  . Alcoholism in remission (HCC) 04/22/2011     Current Outpatient Medications on File Prior to Visit  Medication Sig Dispense Refill  . varenicline (CHANTIX CONTINUING MONTH PAK) 1 MG tablet Take 1 tablet (1 mg total) by mouth 2 (two) times daily. 112 tablet 0  . varenicline (CHANTIX) 0.5 MG tablet Take 1 tablet (0.5 mg total) by mouth 2 (two) times daily. 53 tablet 0   No current facility-administered medications on file prior to visit.     No Known Allergies  Social History   Socioeconomic History  . Marital status: Single    Spouse name: Not on file  . Number of children: Not on file  . Years of education: Not on file  . Highest education level: Not on file  Social Needs  . Financial resource strain: Not on file  . Food insecurity - worry: Not on  file  . Food insecurity - inability: Not on file  . Transportation needs - medical: Not on file  . Transportation needs - non-medical: Not on file  Occupational History  . Not on file  Tobacco Use  . Smoking status: Current Every Day Smoker    Packs/day: 1.00    Years: 30.00    Pack years: 30.00    Types: Cigarettes  . Smokeless tobacco: Never Used  Substance and Sexual Activity  . Alcohol use: No    Alcohol/week: 0.0 oz    Comment: recovering alcoholic - quit drinking 6 years ago  . Drug use: No    Comment: clean 6 years  . Sexual activity: Yes  Other Topics Concern  . Not on file  Social History Narrative  . Not on file    Family History  Problem Relation Age of Onset  . COPD Mother        smoker  . COPD Father        smoker--Lung transplant    Past Surgical History:  Procedure Laterality Date  . HAND SURGERY      ROS: Review of Systems Negative except as stated above PHYSICAL EXAM: BP 122/81   Pulse 78   Temp 98.9 F (37.2 C) (Oral)   Resp 16   Wt 194 lb (88 kg)   SpO2 95%  BMI 29.50 kg/m   Wt Readings from Last 3 Encounters:  08/06/17 194 lb (88 kg)  03/05/17 193 lb (87.5 kg)  11/13/14 183 lb 8 oz (83.2 kg)    Physical Exam  General appearance -patient in NAD Mental status -patient not interactive and does not make eye contact.  Throughout the whole visit he looked down at his cell phone Chest - clear to auscultation, no wheezes, rales or rhonchi, symmetric air entry Heart - normal rate, regular rhythm, normal S1, S2, no murmurs, rubs, clicks or gallops Extremities - peripheral pulses normal, no pedal edema, no clubbing or cyanosis  ASSESSMENT AND PLAN: 1. Essential hypertension At goal. RF Norvasc - amLODipine (NORVASC) 5 MG tablet; Take 1 tablet (5 mg total) by mouth daily.  Dispense: 30 tablet; Refill: 5  2. Tobacco dependence -Patient did not seem willing to engage in conversation about this today  3. Chronic obstructive pulmonary  disease, unspecified COPD type (HCC) - albuterol (PROVENTIL HFA;VENTOLIN HFA) 108 (90 Base) MCG/ACT inhaler; Inhale 2 puffs into the lungs every 6 (six) hours as needed for wheezing or shortness of breath.  Dispense: 54 Inhaler; Refill: 5 - Fluticasone-Salmeterol (ADVAIR) 100-50 MCG/DOSE AEPB; Inhale 1 puff into the lungs 2 (two) times daily.  Dispense: 180 each; Refill: 5  4. Hepatitis C virus infection cured after antiviral drug therapy -last saw ID [id number] and was told to f/u in 1 yr for fibroscan  -will refer 5. Hyperlipidemia, unspecified hyperlipidemia type - atorvastatin (LIPITOR) 10 MG tablet; Take 1 tablet (10 mg total) by mouth daily.  Dispense: 30 tablet; Refill: 5  In regards to his billing concerns I advised the patient to speak with someone at the front desk who may be able to assist him further or get him to the right person to answer his questions  Patient was given the opportunity to ask questions.  Patient verbalized understanding of the plan and was able to repeat key elements of the plan.   No orders of the defined types were placed in this encounter.    Requested Prescriptions   Signed Prescriptions Disp Refills  . atorvastatin (LIPITOR) 10 MG tablet 30 tablet 5    Sig: Take 1 tablet (10 mg total) by mouth daily.  Marland Kitchen amLODipine (NORVASC) 5 MG tablet 30 tablet 5    Sig: Take 1 tablet (5 mg total) by mouth daily.  Marland Kitchen albuterol (PROVENTIL HFA;VENTOLIN HFA) 108 (90 Base) MCG/ACT inhaler 54 Inhaler 5    Sig: Inhale 2 puffs into the lungs every 6 (six) hours as needed for wheezing or shortness of breath.  . Fluticasone-Salmeterol (ADVAIR) 100-50 MCG/DOSE AEPB 180 each 5    Sig: Inhale 1 puff into the lungs 2 (two) times daily.    No Follow-up on file.  Jonah Blue, MD, FACP

## 2017-10-08 ENCOUNTER — Other Ambulatory Visit: Payer: Self-pay

## 2017-10-08 ENCOUNTER — Encounter (HOSPITAL_COMMUNITY): Payer: Self-pay | Admitting: Emergency Medicine

## 2017-10-08 ENCOUNTER — Ambulatory Visit (HOSPITAL_COMMUNITY)
Admission: EM | Admit: 2017-10-08 | Discharge: 2017-10-08 | Disposition: A | Discharge status: Home / Self Care | Payer: Managed Care, Other (non HMO) | Attending: Internal Medicine | Admitting: Internal Medicine

## 2017-10-08 DIAGNOSIS — M545 Low back pain, unspecified: Secondary | ICD-10-CM

## 2017-10-08 MED ORDER — MELOXICAM 7.5 MG PO TABS: 7.5000 mg | ORAL_TABLET | Freq: Every day | ORAL | 0 refills | Status: DC

## 2017-10-08 MED ORDER — CYCLOBENZAPRINE HCL 10 MG PO TABS: 10.0000 mg | ORAL_TABLET | Freq: Every evening | ORAL | 0 refills | Status: DC | PRN

## 2017-10-08 MED ORDER — KETOROLAC TROMETHAMINE 60 MG/2ML IM SOLN
60.0000 mg | Freq: Once | INTRAMUSCULAR | Status: AC
  Administered 2017-10-08: 60 mg via INTRAMUSCULAR

## 2017-10-08 MED ORDER — KETOROLAC TROMETHAMINE 60 MG/2ML IM SOLN
INTRAMUSCULAR | Status: AC
  Filled 2017-10-08: qty 2

## 2017-10-08 NOTE — ED Provider Notes (Signed)
[facility]    CSN: [id number] Arrival date & time: 10/08/17  1028     History   Chief Complaint Chief Complaint  Patient presents with  . Back Pain    HPI Javonta Gronau is a 52 y.o. male.   52 year old male with history of back pain, HLD, COPD, hep C comes in for 4-day history of low back pain.  States he woke up with the pain, and since then has had constant pain that is worse with movement.  He has tried BenGay without relief.  Has not taken anything for the symptoms.  Denies injury/trauma.  Denies saddle anesthesia, loss of bladder or bowel control.  Denies numbness, tingling.  Denies urinary symptoms such as frequency, dysuria, hematuria.  Works as a Psychologist, occupational, requires lots of bending and lifting.      Past Medical History:  Diagnosis Date  . GERD (gastroesophageal reflux disease) 08/10/2011  . Hepatitis C, chronic (HCC)   . Hyperlipidemia 03/10/2017  . Irregular heart beat   . Migraines     Patient Active Problem List   Diagnosis Date Noted  . Hyperlipidemia 03/10/2017  . HTN (hypertension) 03/12/2014  . Tobacco abuse 09/25/2011  . COPD (chronic obstructive pulmonary disease) (HCC) 09/08/2011  . Hepatitis C virus infection cured after antiviral drug therapy 04/22/2011  . Alcoholism in remission (HCC) 04/22/2011    Past Surgical History:  Procedure Laterality Date  . HAND SURGERY         Home Medications    Prior to Admission medications   Medication Sig Start Date End Date Taking? Authorizing Provider  albuterol (PROVENTIL HFA;VENTOLIN HFA) 108 (90 Base) MCG/ACT inhaler Inhale 2 puffs into the lungs every 6 (six) hours as needed for wheezing or shortness of breath. 08/06/17   Marcine Matar, MD  amLODipine (NORVASC) 5 MG tablet Take 1 tablet (5 mg total) by mouth daily. 08/06/17   Marcine Matar, MD  atorvastatin (LIPITOR) 10 MG tablet Take 1 tablet (10 mg total) by mouth daily. 08/06/17   Marcine Matar, MD  cyclobenzaprine (FLEXERIL) 10 MG  tablet Take 1 tablet (10 mg total) by mouth at bedtime as needed for muscle spasms. 10/08/17   Cathie Hoops, Kelby Lotspeich V, PA-C  Fluticasone-Salmeterol (ADVAIR) 100-50 MCG/DOSE AEPB Inhale 1 puff into the lungs 2 (two) times daily. 08/06/17   Marcine Matar, MD  meloxicam (MOBIC) 7.5 MG tablet Take 1 tablet (7.5 mg total) by mouth daily. 10/08/17   Cathie Hoops, Deepak Bless V, PA-C  varenicline (CHANTIX CONTINUING MONTH PAK) 1 MG tablet Take 1 tablet (1 mg total) by mouth 2 (two) times daily. Patient not taking: Reported on 10/08/2017 05/04/17   Marcine Matar, MD  varenicline (CHANTIX) 0.5 MG tablet Take 1 tablet (0.5 mg total) by mouth 2 (two) times daily. Patient not taking: Reported on 10/08/2017 04/27/17   Marcine Matar, MD    Family History Family History  Problem Relation Age of Onset  . COPD Mother        smoker  . COPD Father        smoker--Lung transplant    Social History Social History   Tobacco Use  . Smoking status: Current Every Day Smoker    Packs/day: 1.00    Years: 30.00    Pack years: 30.00    Types: Cigarettes  . Smokeless tobacco: Never Used  Substance Use Topics  . Alcohol use: No    Alcohol/week: 0.0 oz    Comment: recovering alcoholic - quit drinking  6 years ago  . Drug use: No    Comment: clean 6 years     Allergies   Patient has no known allergies.   Review of Systems Review of Systems  Reason unable to perform ROS: See HPI as above.     Physical Exam Triage Vital Signs ED Triage Vitals  Enc Vitals Group     BP 10/08/17 1103 (!) 141/81     Pulse Rate 10/08/17 1102 100     Resp 10/08/17 1102 18     Temp 10/08/17 1102 98.1 F (36.7 C)     Temp src --      SpO2 10/08/17 1102 100 %     Weight --      Height --      Head Circumference --      Peak Flow --      Pain Score 10/08/17 1102 10     Pain Loc --      Pain Edu? --      Excl. in GC? --    No data found.  Updated Vital Signs BP (!) 141/81   Pulse 100   Temp 98.1 F (36.7 C)   Resp 18   SpO2 100%    Physical Exam  Constitutional: He is oriented to person, place, and time. He appears well-developed and well-nourished. No distress.  HENT:  Head: Normocephalic and atraumatic.  Eyes: Conjunctivae are normal. Pupils are equal, round, and reactive to light.  Cardiovascular: Normal rate, regular rhythm and normal heart sounds. Exam reveals no gallop and no friction rub.  No murmur heard. Pulmonary/Chest: Effort normal and breath sounds normal. He has no wheezes. He has no rales.  Musculoskeletal:  No tenderness on palpation of the spinous processes.  Tenderness to palpation of right lumbar region.  Full range of motion of back.  Full active range of motion of left hip.  Full passive range of motion of right hip.  Patient unable to do active range of motion of right hip due to right back pain, no radiation of pain.  Strength normal with left hip.  Strength deferred on right hip.  Sensation intact and equal bilaterally.  Negative straight leg raise.  Neurological: He is alert and oriented to person, place, and time.  Skin: Skin is warm and dry.    UC Treatments / Results  Labs (all labs ordered are listed, but only abnormal results are displayed) Labs Reviewed - No data to display  EKG  EKG Interpretation None       Radiology No results found.  Procedures Procedures (including critical care time)  Medications Ordered in UC Medications  ketorolac (TORADOL) injection 60 mg (60 mg Intramuscular Given 10/08/17 1147)     Initial Impression / Assessment and Plan / UC Course  I have reviewed the triage vital signs and the nursing notes.  Pertinent labs & imaging results that were available during my care of the patient were reviewed by me and considered in my medical decision making (see chart for details).    Toradol injection in office today. Start NSAID as directed for pain and inflammation. Muscle relaxant as needed. Ice/heat compresses. Discussed with patient this can take up  to 3-4 weeks to resolve, but should be getting better each week. Return precautions given.  Patient expresses understanding and agrees to plan.    Final Clinical Impressions(s) / UC Diagnoses   Final diagnoses:  Acute right-sided low back pain without sciatica    ED Discharge Orders  Ordered    meloxicam (MOBIC) 7.5 MG tablet  Daily     10/08/17 1146    cyclobenzaprine (FLEXERIL) 10 MG tablet  At bedtime PRN     10/08/17 1146        Belinda Fisher, PA-C 10/08/17 1150

## 2017-10-08 NOTE — Discharge Instructions (Signed)
Toradol injection in office today. Start Mobic as directed. Flexeril as needed at night. Flexeril can make you drowsy, so do not take if you are going to drive, operate heavy machinery, or make important decisions. Ice/heat compresses as needed. This can take up to 3-4 weeks to completely resolve, but you should be feeling better each week. Follow up here or with PCP if symptoms worsen, changes for reevaluation. If experience numbness/tingling of the inner thighs, loss of bladder or bowel control, go to the emergency department for evaluation.

## 2017-10-08 NOTE — ED Triage Notes (Signed)
Pt c/o back pain since Monday, hx of back problems. Pt states "it hurts even when I push my clutch".

## 2017-10-21 ENCOUNTER — Encounter: Payer: Self-pay | Admitting: Internal Medicine

## 2017-10-21 ENCOUNTER — Ambulatory Visit (INDEPENDENT_AMBULATORY_CARE_PROVIDER_SITE_OTHER): Payer: Managed Care, Other (non HMO) | Admitting: Internal Medicine

## 2017-10-21 DIAGNOSIS — Z8619 Personal history of other infectious and parasitic diseases: Secondary | ICD-10-CM | POA: Diagnosis not present

## 2017-10-21 DIAGNOSIS — K7400 Hepatic fibrosis, unspecified: Secondary | ICD-10-CM

## 2017-10-21 NOTE — Addendum Note (Signed)
Addended by: Gardiner BarefootOMER, Denys Labree W on: 10/21/2017 09:25 AM   Modules accepted: Orders

## 2017-10-21 NOTE — Progress Notes (Signed)
   Subjective:    Patient ID: Connor Oneal, male    DOB: 1966/03/09, [age] y.o.   MRN: [id number]  HPI Here for follow up of HCV. He had genotype 1a and treated successfully with Harvoni for 12 weeks.  He also had F2/3 disease on elastography.  He has had normal platelets, normal albumin.  No associated fatigue.    Review of Systems  Constitutional: Negative for fatigue.  Skin: Negative for rash.       Objective:   Physical Exam  Constitutional: He appears well-developed and well-nourished. No distress.  Eyes: No scleral icterus.  Cardiovascular: Normal rate, regular rhythm and normal heart sounds.  No murmur heard. Pulmonary/Chest: Effort normal and breath sounds normal. No respiratory distress.  Skin: No rash noted.    SH: continues to remain alcohol free      Assessment & Plan:

## 2017-10-21 NOTE — Assessment & Plan Note (Signed)
Repeated lab in January and viral load remains negative.  I discussed that the antibody will always remain positive but the hepatitis C is cured.

## 2017-10-21 NOTE — Assessment & Plan Note (Signed)
Will recheck an ultrasound and if ok, no follow up indicated.  No indication for HCC screening.

## 2017-11-01 ENCOUNTER — Telehealth: Payer: Self-pay | Admitting: Behavioral Health

## 2017-11-01 ENCOUNTER — Ambulatory Visit
Admission: RE | Admit: 2017-11-01 | Discharge: 2017-11-01 | Disposition: A | Discharge status: Home / Self Care | Payer: Managed Care, Other (non HMO) | Source: Ambulatory Visit | Attending: Internal Medicine | Admitting: Internal Medicine

## 2017-11-01 DIAGNOSIS — Z8619 Personal history of other infectious and parasitic diseases: Secondary | ICD-10-CM

## 2017-11-01 NOTE — Telephone Encounter (Signed)
Called Connor Oneal, verified identity, informed him that per Dr. Luciana Axeomer, his ultrasound looked good.  Also informed him that he does not need to follow up at this time and Dr. Luciana Axeomer has no concerns. Patient verbalized understanding. Angeline SlimAshley Trice Aspinall RN

## 2017-11-01 NOTE — Telephone Encounter (Signed)
-----   Message from Gardiner Barefootobert W Comer, MD sent at 11/01/2017  3:49 PM EDT ----- Let him know his repeat ulturasound looks good, no concerns.  No follow up needed. thanks

## 2017-12-06 ENCOUNTER — Encounter: Payer: Self-pay | Admitting: Internal Medicine

## 2017-12-06 ENCOUNTER — Ambulatory Visit: Payer: Managed Care, Other (non HMO) | Attending: Internal Medicine | Admitting: Internal Medicine

## 2017-12-06 ENCOUNTER — Other Ambulatory Visit: Payer: Self-pay

## 2017-12-06 VITALS — BP 110/90 | HR 82 | Temp 98.3°F | Wt 191.8 lb

## 2017-12-06 DIAGNOSIS — I10 Essential (primary) hypertension: Secondary | ICD-10-CM | POA: Diagnosis not present

## 2017-12-06 DIAGNOSIS — E785 Hyperlipidemia, unspecified: Secondary | ICD-10-CM | POA: Insufficient documentation

## 2017-12-06 DIAGNOSIS — M545 Low back pain, unspecified: Secondary | ICD-10-CM

## 2017-12-06 DIAGNOSIS — J449 Chronic obstructive pulmonary disease, unspecified: Secondary | ICD-10-CM | POA: Insufficient documentation

## 2017-12-06 DIAGNOSIS — B1920 Unspecified viral hepatitis C without hepatic coma: Secondary | ICD-10-CM | POA: Diagnosis not present

## 2017-12-06 DIAGNOSIS — F1021 Alcohol dependence, in remission: Secondary | ICD-10-CM | POA: Insufficient documentation

## 2017-12-06 DIAGNOSIS — F17200 Nicotine dependence, unspecified, uncomplicated: Secondary | ICD-10-CM

## 2017-12-06 DIAGNOSIS — R079 Chest pain, unspecified: Secondary | ICD-10-CM | POA: Diagnosis not present

## 2017-12-06 DIAGNOSIS — F17210 Nicotine dependence, cigarettes, uncomplicated: Secondary | ICD-10-CM | POA: Diagnosis not present

## 2017-12-06 DIAGNOSIS — Z7951 Long term (current) use of inhaled steroids: Secondary | ICD-10-CM | POA: Diagnosis not present

## 2017-12-06 DIAGNOSIS — Z79899 Other long term (current) drug therapy: Secondary | ICD-10-CM | POA: Insufficient documentation

## 2017-12-06 MED ORDER — NICOTINE 7 MG/24HR TD PT24: 7.0000 mg | MEDICATED_PATCH | Freq: Every day | TRANSDERMAL | 0 refills | Status: DC

## 2017-12-06 MED ORDER — NICOTINE 21 MG/24HR TD PT24: 21.0000 mg | MEDICATED_PATCH | Freq: Every day | TRANSDERMAL | 0 refills | Status: DC

## 2017-12-06 MED ORDER — NICOTINE 14 MG/24HR TD PT24: 14.0000 mg | MEDICATED_PATCH | Freq: Every day | TRANSDERMAL | 0 refills | Status: DC

## 2017-12-06 NOTE — Progress Notes (Signed)
Pt voices no concerns when asked.
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
## 2017-12-06 NOTE — Patient Instructions (Signed)
Try to avoid heavy lift and other activities that cause flare up of your back pain.   Try to get in some form of aerobic exercise like brisk walking 3-4 days a week for 30 minutes.  Please follow up if you have any further chest pain episodes or chest pain with exertion.    Start the nicotine patches as discussed.  Start with the 21 mg patches and use daily for 1 month.  Then start the 14 mg patches for 1 month.

## 2018-01-01 DIAGNOSIS — Z87820 Personal history of traumatic brain injury: Secondary | ICD-10-CM

## 2018-01-01 HISTORY — DX: Personal history of traumatic brain injury: Z87.820

## 2018-01-12 ENCOUNTER — Other Ambulatory Visit (HOSPITAL_COMMUNITY)
Admission: RE | Admit: 2018-01-12 | Discharge: 2018-01-12 | Disposition: A | Discharge status: Home / Self Care | Payer: Managed Care, Other (non HMO) | Source: Ambulatory Visit | Attending: Internal Medicine | Admitting: Internal Medicine

## 2018-01-12 ENCOUNTER — Ambulatory Visit: Payer: Managed Care, Other (non HMO) | Attending: Internal Medicine | Admitting: Physician Assistant

## 2018-01-12 VITALS — BP 134/81 | HR 98 | Temp 98.6°F | Resp 18 | Ht 68.0 in | Wt 192.0 lb

## 2018-01-12 DIAGNOSIS — F17200 Nicotine dependence, unspecified, uncomplicated: Secondary | ICD-10-CM

## 2018-01-12 DIAGNOSIS — Z79899 Other long term (current) drug therapy: Secondary | ICD-10-CM | POA: Diagnosis not present

## 2018-01-12 DIAGNOSIS — Z791 Long term (current) use of non-steroidal anti-inflammatories (NSAID): Secondary | ICD-10-CM | POA: Diagnosis not present

## 2018-01-12 DIAGNOSIS — Z113 Encounter for screening for infections with a predominantly sexual mode of transmission: Secondary | ICD-10-CM | POA: Diagnosis present

## 2018-01-12 DIAGNOSIS — Z202 Contact with and (suspected) exposure to infections with a predominantly sexual mode of transmission: Secondary | ICD-10-CM | POA: Diagnosis not present

## 2018-01-12 DIAGNOSIS — K219 Gastro-esophageal reflux disease without esophagitis: Secondary | ICD-10-CM | POA: Diagnosis not present

## 2018-01-12 DIAGNOSIS — B182 Chronic viral hepatitis C: Secondary | ICD-10-CM | POA: Diagnosis not present

## 2018-01-12 DIAGNOSIS — E785 Hyperlipidemia, unspecified: Secondary | ICD-10-CM | POA: Diagnosis not present

## 2018-01-12 LAB — POCT URINALYSIS DIPSTICK
Bilirubin, UA: NEGATIVE
Blood, UA: NEGATIVE
Glucose, UA: NEGATIVE
Leukocytes, UA: NEGATIVE
Nitrite, UA: NEGATIVE
Protein, UA: POSITIVE — AB
Spec Grav, UA: 1.02 (ref 1.010–1.025)
Urobilinogen, UA: 1 E.U./dL
pH, UA: 5.5 (ref 5.0–8.0)

## 2018-01-12 NOTE — Progress Notes (Signed)
anciPatient ID: Connor Oneal, male   DOB: Dec 08, 1965, 52 y.o.   MRN: [id number]     Connor Oneal, is a 52 y.o. male  [id number]  [id number]  DOB - May 23, 1966  Subjective:  Chief Complaint and HPI: Connor Oneal is a 52 y.o. male here today for STD screening.  He has had 2 new partners in the last couple of weeks and is now complaining that his penis "doesn't feel right."  Penile itching and irritation. No lesions.  No dysuria.   No f/c.  No discharge. Not using condoms.   ROS:   Constitutional:  No f/c, No night sweats, No unexplained weight loss. EENT:  No vision changes, No blurry vision, No hearing changes. No mouth, throat, or ear problems.  Respiratory: No cough, No SOB Cardiac: No CP, no palpitations GI:  No abd pain, No N/V/D. GU: No Urinary s/sx Musculoskeletal: No joint pain Neuro: No headache, no dizziness, no motor weakness.  Skin: No rash Endocrine:  No polydipsia. No polyuria.  Psych: Denies SI/HI  No problems updated.  ALLERGIES: No Known Allergies  PAST MEDICAL HISTORY: Past Medical History:  Diagnosis Date  . GERD (gastroesophageal reflux disease) 08/10/2011  . Hepatitis C, chronic (HCC)   . Hyperlipidemia 03/10/2017  . Irregular heart beat   . Migraines     MEDICATIONS AT HOME: Prior to Admission medications   Medication Sig Start Date End Date Taking? Authorizing Provider  albuterol (PROVENTIL HFA;VENTOLIN HFA) 108 (90 Base) MCG/ACT inhaler Inhale 2 puffs into the lungs every 6 (six) hours as needed for wheezing or shortness of breath. 08/06/17  Yes Marcine Matar, MD  amLODipine (NORVASC) 5 MG tablet Take 1 tablet (5 mg total) by mouth daily. 08/06/17  Yes Marcine Matar, MD  atorvastatin (LIPITOR) 10 MG tablet Take 1 tablet (10 mg total) by mouth daily. 08/06/17  Yes Marcine Matar, MD  Fluticasone-Salmeterol (ADVAIR) 100-50 MCG/DOSE AEPB Inhale 1 puff into the lungs 2 (two) times daily. 08/06/17  Yes Marcine Matar, MD    meloxicam (MOBIC) 7.5 MG tablet Take 1 tablet (7.5 mg total) by mouth daily. Patient not taking: Reported on 10/21/2017 10/08/17   Belinda Fisher, PA-C  nicotine (NICODERM CQ - DOSED IN MG/24 HOURS) 14 mg/24hr patch Place 1 patch (14 mg total) onto the skin daily. Patient not taking: Reported on 01/12/2018 12/06/17   Marcine Matar, MD  nicotine (NICODERM CQ - DOSED IN MG/24 HOURS) 21 mg/24hr patch Place 1 patch (21 mg total) onto the skin daily. Patient not taking: Reported on 01/12/2018 12/06/17   Marcine Matar, MD  nicotine (NICODERM CQ - DOSED IN MG/24 HR) 7 mg/24hr patch Place 1 patch (7 mg total) onto the skin daily. Patient not taking: Reported on 01/12/2018 12/06/17   Marcine Matar, MD  varenicline (CHANTIX CONTINUING MONTH PAK) 1 MG tablet Take 1 tablet (1 mg total) by mouth 2 (two) times daily. Patient not taking: Reported on 10/08/2017 05/04/17   Marcine Matar, MD     Objective:  EXAM:   Vitals:   01/12/18 1044  BP: 134/81  Pulse: 98  Resp: 18  Temp: 98.6 F (37 C)  TempSrc: Oral  SpO2: 95%  Weight: 192 lb (87.1 kg)  Height: 5\' 8"  (1.727 m)    General appearance : A&OX3. NAD. Non-toxic-appearing HEENT: Atraumatic and Normocephalic.  PERRLA. EOM intact.  Neck: supple, no JVD. No cervical lymphadenopathy. No thyromegaly Chest/Lungs:  Breathing-non-labored, Good air entry bilaterally, breath sounds  normal without rales, rhonchi, or wheezing  CVS: S1 S2 regular, no murmurs, gallops, rubs  Extremities: Bilateral Lower Ext shows no edema, both legs are warm to touch with = pulse throughout Neurology:  CN II-XII grossly intact, Non focal.   Psych:  TP linear. J/I WNL. Normal speech. Appropriate eye contact and affect.  Skin:  No Rash  Data Review No results found for: HGBA1C   Assessment & Plan   1. STD exposure Drink more water.  Safe sex practices always.  [facility]   - Urinalysis Dipstick - Urine cytology ancillary only - HIV  antibody (with reflex) - RPR  2. Tobacco dependence Smoking cessation encouraged.     Patient have been counseled extensively about nutrition and exercise  Return if symptoms worsen or fail to improve.  The patient was given clear instructions to go to ER or return to medical center if symptoms don't improve, worsen or new problems develop. The patient verbalized understanding. The patient was told to call to get lab results if they haven't heard anything in the next week.     Connor CoAngela Emberlee Sortino, PA-C [facility] [place], [place] [phone]   01/12/2018, 10:48 AM

## 2018-01-12 NOTE — Patient Instructions (Signed)
Sexually Transmitted Disease  A sexually transmitted disease (STD) is a disease or infection that may be passed (transmitted) from person to person, usually during sexual activity. This may happen by way of saliva, semen, blood, vaginal mucus, or urine. Common STDs include:   Gonorrhea.   Chlamydia.   Syphilis.   HIV and AIDS.   Genital herpes.   Hepatitis B and C.   Trichomonas.   Human papillomavirus (HPV).   Pubic lice.   Scabies.   Mites.   Bacterial vaginosis.    What are the causes?  An STD may be caused by bacteria, a virus, or parasites. STDs are often transmitted during sexual activity if one person is infected. However, they may also be transmitted through nonsexual means. STDs may be transmitted after:   Sexual intercourse with an infected person.   Sharing sex toys with an infected person.   Sharing needles with an infected person or using unclean piercing or tattoo needles.   Having intimate contact with the genitals, mouth, or rectal areas of an infected person.   Exposure to infected fluids during birth.    What are the signs or symptoms?  Different STDs have different symptoms. Some people may not have any symptoms. If symptoms are present, they may include:   Painful or bloody urination.   Pain in the pelvis, abdomen, vagina, anus, throat, or eyes.   A skin rash, itching, or irritation.   Growths, ulcerations, blisters, or sores in the genital and anal areas.   Abnormal vaginal discharge with or without bad odor.   Penile discharge in men.   Fever.   Pain or bleeding during sexual intercourse.   Swollen glands in the groin area.   Yellow skin and eyes (jaundice). This is seen with hepatitis.   Swollen testicles.   Infertility.   Sores and blisters in the mouth.    How is this diagnosed?  To make a diagnosis, your health care provider may:   Take a medical history.   Perform a physical exam.   Take a sample of any discharge to examine.   Swab the throat, cervix,  opening to the penis, rectum, or vagina for testing.   Test a sample of your first morning urine.   Perform blood tests.   Perform a Pap test, if this applies.   Perform a colposcopy.   Perform a laparoscopy.    How is this treated?  Treatment depends on the STD. Some STDs may be treated but not cured.   Chlamydia, gonorrhea, trichomonas, and syphilis can be cured with antibiotic medicine.   Genital herpes, hepatitis, and HIV can be treated, but not cured, with prescribed medicines. The medicines lessen symptoms.   Genital warts from HPV can be treated with medicine or by freezing, burning (electrocautery), or surgery. Warts may come back.   HPV cannot be cured with medicine or surgery. However, abnormal areas may be removed from the cervix, vagina, or vulva.   If your diagnosis is confirmed, your recent sexual partners need treatment. This is true even if they are symptom-free or have a negative culture or evaluation. They should not have sex until their health care providers say it is okay.   Your health care provider may test you for infection again 3 months after treatment.    How is this prevented?  Take these steps to reduce your risk of getting an STD:   Use latex condoms, dental dams, and water-soluble lubricants during sexual activity. Do not use   petroleum jelly or oils.   Avoid having multiple sex partners.   Do not have sex with someone who has other sex partners.   Do not have sex with anyone you do not know or who is at high risk for an STD.   Avoid risky sex practices that can break your skin.   Do not have sex if you have open sores on your mouth or skin.   Avoid drinking too much alcohol or taking illegal drugs. Alcohol and drugs can affect your judgment and put you in a vulnerable position.   Avoid engaging in oral and anal sex acts.   Get vaccinated for HPV and hepatitis. If you have not received these vaccines in the past, talk to your health care provider about whether one or  both might be right for you.   If you are at risk of being infected with HIV, it is recommended that you take a prescription medicine daily to prevent HIV infection. This is called pre-exposure prophylaxis (PrEP). You are considered at risk if:  ? You are a man who has sex with other men (MSM).  ? You are a heterosexual man or woman and are sexually active with more than one partner.  ? You take drugs by injection.  ? You are sexually active with a partner who has HIV.   Talk with your health care provider about whether you are at high risk of being infected with HIV. If you choose to begin PrEP, you should first be tested for HIV. You should then be tested every 3 months for as long as you are taking PrEP.    Contact a health care provider if:   See your health care provider.   Tell your sexual partner(s). They should be tested and treated for any STDs.   Do not have sex until your health care provider says it is okay.  Get help right away if:  Contact your health care provider right away if:   You have severe abdominal pain.   You are a man and notice swelling or pain in your testicles.   You are a woman and notice swelling or pain in your vagina.    This information is not intended to replace advice given to you by your health care provider. Make sure you discuss any questions you have with your health care provider.  Document Released: 10/10/2002 Document Revised: 02/07/2016 Document Reviewed: 02/07/2013  Elsevier Interactive Patient Education  2018 [employer]vier Inc[employer]

## 2018-01-13 LAB — HIV ANTIBODY (ROUTINE TESTING W REFLEX): HIV Screen 4th Generation wRfx: NONREACTIVE

## 2018-01-13 LAB — URINE CYTOLOGY ANCILLARY ONLY
Chlamydia: NEGATIVE
Neisseria Gonorrhea: NEGATIVE
Trichomonas: NEGATIVE

## 2018-01-13 LAB — RPR: RPR Ser Ql: NONREACTIVE

## 2018-01-17 ENCOUNTER — Telehealth: Payer: Self-pay | Admitting: Internal Medicine

## 2018-01-17 ENCOUNTER — Telehealth: Payer: Self-pay | Admitting: *Deleted

## 2018-01-17 NOTE — Telephone Encounter (Signed)
Medical Assistant left message on patient's home and cell voicemail. Voicemail states to give a call back to [place] with [facility] at [phone]. !!Please inform patient of all STD's being negative and needing to adhere to safe sex practices. One partner and protection!!!

## 2018-01-17 NOTE — Telephone Encounter (Signed)
I inform patient of all STD's being negative and needing to adhere to safe sex practices. One partner and protection!!!

## 2018-01-17 NOTE — Telephone Encounter (Signed)
-----   Message from Anders SimmondsAngela M McClung, [place] sent at 01/14/2018  7:18 PM EDT ----- Please call patient.  His HIV, syphilis, chlaymydia, gonorrhea, and trichomonas testing are all negative.  Advise safe sex practices.   Thanks,  Kerr-McGeengela mcClung, PA-C

## 2018-01-24 ENCOUNTER — Emergency Department (HOSPITAL_COMMUNITY): Payer: Managed Care, Other (non HMO)

## 2018-01-24 ENCOUNTER — Encounter (HOSPITAL_COMMUNITY): Payer: Self-pay | Admitting: Emergency Medicine

## 2018-01-24 ENCOUNTER — Emergency Department (HOSPITAL_COMMUNITY)
Admission: EM | Admit: 2018-01-24 | Discharge: 2018-01-24 | Disposition: A | Discharge status: Home / Self Care | Payer: Managed Care, Other (non HMO) | Attending: Emergency Medicine | Admitting: Emergency Medicine

## 2018-01-24 DIAGNOSIS — S060X0A Concussion without loss of consciousness, initial encounter: Secondary | ICD-10-CM

## 2018-01-24 DIAGNOSIS — R079 Chest pain, unspecified: Secondary | ICD-10-CM | POA: Insufficient documentation

## 2018-01-24 DIAGNOSIS — Y999 Unspecified external cause status: Secondary | ICD-10-CM | POA: Diagnosis not present

## 2018-01-24 DIAGNOSIS — R0789 Other chest pain: Secondary | ICD-10-CM

## 2018-01-24 DIAGNOSIS — R51 Headache: Secondary | ICD-10-CM | POA: Diagnosis present

## 2018-01-24 DIAGNOSIS — Z79899 Other long term (current) drug therapy: Secondary | ICD-10-CM | POA: Diagnosis not present

## 2018-01-24 DIAGNOSIS — F17210 Nicotine dependence, cigarettes, uncomplicated: Secondary | ICD-10-CM | POA: Insufficient documentation

## 2018-01-24 DIAGNOSIS — Y9389 Activity, other specified: Secondary | ICD-10-CM | POA: Diagnosis not present

## 2018-01-24 DIAGNOSIS — I10 Essential (primary) hypertension: Secondary | ICD-10-CM | POA: Insufficient documentation

## 2018-01-24 DIAGNOSIS — Y92410 Unspecified street and highway as the place of occurrence of the external cause: Secondary | ICD-10-CM | POA: Insufficient documentation

## 2018-01-24 HISTORY — DX: Essential (primary) hypertension: I10

## 2018-01-24 MED ORDER — NAPROXEN 500 MG PO TABS: 500.0000 mg | ORAL_TABLET | Freq: Two times a day (BID) | ORAL | 0 refills | Status: DC | PRN

## 2018-01-24 MED ORDER — NAPROXEN 500 MG PO TABS
500.0000 mg | ORAL_TABLET | Freq: Once | ORAL | Status: AC
  Administered 2018-01-24: 500 mg via ORAL
  Filled 2018-01-24: qty 1
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
## 2018-01-24 NOTE — ED Notes (Signed)
Bed: WTR8 Expected date:  Expected time:  Means of arrival:  Comments: 
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
## 2018-03-30 ENCOUNTER — Other Ambulatory Visit: Payer: Self-pay | Admitting: Internal Medicine

## 2018-03-30 DIAGNOSIS — I10 Essential (primary) hypertension: Secondary | ICD-10-CM

## 2018-05-05 ENCOUNTER — Ambulatory Visit: Payer: Managed Care, Other (non HMO) | Attending: Internal Medicine | Admitting: Internal Medicine

## 2018-05-05 ENCOUNTER — Encounter: Payer: Self-pay | Admitting: Internal Medicine

## 2018-05-05 VITALS — BP 130/87 | HR 81 | Temp 98.0°F | Resp 16 | Ht 68.0 in | Wt 188.0 lb

## 2018-05-05 DIAGNOSIS — J449 Chronic obstructive pulmonary disease, unspecified: Secondary | ICD-10-CM | POA: Insufficient documentation

## 2018-05-05 DIAGNOSIS — F17200 Nicotine dependence, unspecified, uncomplicated: Secondary | ICD-10-CM

## 2018-05-05 DIAGNOSIS — I10 Essential (primary) hypertension: Secondary | ICD-10-CM | POA: Diagnosis not present

## 2018-05-05 DIAGNOSIS — Z79899 Other long term (current) drug therapy: Secondary | ICD-10-CM | POA: Insufficient documentation

## 2018-05-05 DIAGNOSIS — Z2821 Immunization not carried out because of patient refusal: Secondary | ICD-10-CM | POA: Diagnosis not present

## 2018-05-05 DIAGNOSIS — F17210 Nicotine dependence, cigarettes, uncomplicated: Secondary | ICD-10-CM | POA: Insufficient documentation

## 2018-05-05 DIAGNOSIS — E785 Hyperlipidemia, unspecified: Secondary | ICD-10-CM | POA: Diagnosis not present

## 2018-05-05 MED ORDER — AMLODIPINE BESYLATE 5 MG PO TABS: 5.0000 mg | ORAL_TABLET | Freq: Every day | ORAL | 11 refills | Status: DC

## 2018-05-05 MED ORDER — ATORVASTATIN CALCIUM 10 MG PO TABS: 10.0000 mg | ORAL_TABLET | Freq: Every day | ORAL | 11 refills | Status: DC

## 2018-05-05 NOTE — Progress Notes (Signed)
Patient is here to follow-up on hypertension.  Patient request to have have his Amlodipine refill.

## 2018-05-05 NOTE — Progress Notes (Signed)
Patient ID: Connor Oneal, male    DOB: 07/08/1966  MRN: [id number]  CC: Follow-up   Subjective: Connor Oneal is a 52 y.o. male who presents for chronic disease management His concerns today include: 52 year old male with history of hepatitis C that has been cured withHarvonias of 2016, COPD, tobacco dependence and HTN.  HTN:  Out of Norvasc x 3 days.  Patient is upset about this stating that he should have enough refills to last him until his next appointments.  I pointed out that if he did run out he could always ask the pharmacy to send [place] an electronic request. No device to check BP. Tries to limit salt in foods  Tob dep:  "the patches burnt my chest."   Have not been able to cut back at all.  Smoking 1 pk a day.  Endorses cough and sometimes shortness of breath which she states is due to his smoking.  Patient Active Problem List   Diagnosis Date Noted  . Liver fibrosis 10/21/2017  . Hyperlipidemia 03/10/2017  . HTN (hypertension) 03/12/2014  . Tobacco abuse 09/25/2011  . COPD (chronic obstructive pulmonary disease) (HCC) 09/08/2011  . Hepatitis C virus infection cured after antiviral drug therapy 04/22/2011  . Alcoholism in remission (HCC) 04/22/2011     Current Outpatient Medications on File Prior to Visit  Medication Sig Dispense Refill  . albuterol (PROVENTIL HFA;VENTOLIN HFA) 108 (90 Base) MCG/ACT inhaler Inhale 2 puffs into the lungs every 6 (six) hours as needed for wheezing or shortness of breath. 54 Inhaler 5  . amLODipine (NORVASC) 5 MG tablet Take 1 tablet (5 mg total) by mouth daily. MUST MAKE APPT FOR FURTHER REFILLS 30 tablet 0  . atorvastatin (LIPITOR) 10 MG tablet Take 1 tablet (10 mg total) by mouth daily. 30 tablet 5  . Fluticasone-Salmeterol (ADVAIR) 100-50 MCG/DOSE AEPB Inhale 1 puff into the lungs 2 (two) times daily. 180 each 5  . naproxen (NAPROSYN) 500 MG tablet Take 1 tablet (500 mg total) by mouth 2 (two) times daily as needed for mild  pain, moderate pain or headache (TAKE WITH MEALS.). 20 tablet 0  . meloxicam (MOBIC) 7.5 MG tablet Take 1 tablet (7.5 mg total) by mouth daily. (Patient not taking: Reported on 10/21/2017) 15 tablet 0  . nicotine (NICODERM CQ - DOSED IN MG/24 HOURS) 14 mg/24hr patch Place 1 patch (14 mg total) onto the skin daily. (Patient not taking: Reported on 01/12/2018) 28 patch 0  . nicotine (NICODERM CQ - DOSED IN MG/24 HOURS) 21 mg/24hr patch Place 1 patch (21 mg total) onto the skin daily. (Patient not taking: Reported on 01/12/2018) 28 patch 0  . nicotine (NICODERM CQ - DOSED IN MG/24 HR) 7 mg/24hr patch Place 1 patch (7 mg total) onto the skin daily. (Patient not taking: Reported on 01/12/2018) 28 patch 0  . varenicline (CHANTIX CONTINUING MONTH PAK) 1 MG tablet Take 1 tablet (1 mg total) by mouth 2 (two) times daily. (Patient not taking: Reported on 10/08/2017) 112 tablet 0   No current facility-administered medications on file prior to visit.     No Known Allergies  Social History   Socioeconomic History  . Marital status: Single    Spouse name: Not on file  . Number of children: Not on file  . Years of education: Not on file  . Highest education level: Not on file  Occupational History  . Not on file  Social Needs  . Financial resource strain: Not on file  .  Food insecurity:    Worry: Not on file    Inability: Not on file  . Transportation needs:    Medical: Not on file    Non-medical: Not on file  Tobacco Use  . Smoking status: Current Every Day Smoker    Packs/day: 1.00    Years: 30.00    Pack years: 30.00    Types: Cigarettes  . Smokeless tobacco: Never Used  Substance and Sexual Activity  . Alcohol use: No    Alcohol/week: 0.0 standard drinks    Comment: recovering alcoholic - quit drinking 6 years ago  . Drug use: No    Comment: clean 6 years  . Sexual activity: Yes  Lifestyle  . Physical activity:    Days per week: Not on file    Minutes per session: Not on file  .  Stress: Not on file  Relationships  . Social connections:    Talks on phone: Not on file    Gets together: Not on file    Attends religious service: Not on file    Active member of club or organization: Not on file    Attends meetings of clubs or organizations: Not on file    Relationship status: Not on file  . Intimate partner violence:    Fear of current or ex partner: Not on file    Emotionally abused: Not on file    Physically abused: Not on file    Forced sexual activity: Not on file  Other Topics Concern  . Not on file  Social History Narrative   Patient has unprotected sex with multiple partners within a months time    Family History  Problem Relation Age of Onset  . COPD Mother        smoker  . COPD Father        smoker--Lung transplant    Past Surgical History:  Procedure Laterality Date  . HAND SURGERY      ROS: Review of Systems Negative except as stated above  PHYSICAL EXAM: BP 130/87 (BP Location: Left Arm, Patient Position: Sitting, Cuff Size: Normal)   Pulse 81   Temp 98 F (36.7 C) (Oral)   Resp 16   Ht 5\' 8"  (1.727 m)   Wt 188 lb (85.3 kg)   SpO2 93%   BMI 28.59 kg/m   Physical Exam General appearance - alert, well appearing, and in no distress Mental status -patient acts hostile by delaying in answering questions.  He makes very little eye contact preferring instead to look on his c-phone. Chest - clear to auscultation, no wheezes, rales or rhonchi, symmetric air entry Heart - normal rate, regular rhythm, normal S1, S2, no murmurs, rubs, clicks or gallops Extremities - no LE edema.  Mild varicose veins  ASSESSMENT AND PLAN: 1. Essential hypertension Not at goal.  Refill given on amlodipine.  Continue current medication at current dose. - amLODipine (NORVASC) 5 MG tablet; Take 1 tablet (5 mg total) by mouth daily. MUST MAKE APPT FOR FURTHER REFILLS  Dispense: 30 tablet; Refill: 11 - CBC - Comprehensive metabolic panel - Lipid panel  2.  Hyperlipidemia, unspecified hyperlipidemia type - atorvastatin (LIPITOR) 10 MG tablet; Take 1 tablet (10 mg total) by mouth daily.  Dispense: 30 tablet; Refill: 11  3. Tobacco dependence Advised to quit.  Patient not willing at this time to give a trial of quitting.  4. Influenza vaccination declined    Patient was given the opportunity to ask questions.  Patient verbalized understanding  of the plan and was able to repeat key elements of the plan.   No orders of the defined types were placed in this encounter.    Requested Prescriptions    No prescriptions requested or ordered in this encounter    No follow-ups on file.  Jonah Blue, MD, FACP

## 2018-05-06 LAB — CBC
Hematocrit: 41.4 % (ref 37.5–51.0)
Hemoglobin: 13.7 g/dL (ref 13.0–17.7)
MCH: 26.9 pg (ref 26.6–33.0)
MCHC: 33.1 g/dL (ref 31.5–35.7)
MCV: 81 fL (ref 79–97)
Platelets: 299 10*3/uL (ref 150–450)
RBC: 5.1 x10E6/uL (ref 4.14–5.80)
RDW: 13.2 % (ref 12.3–15.4)
WBC: 10.6 10*3/uL (ref 3.4–10.8)

## 2018-05-06 LAB — COMPREHENSIVE METABOLIC PANEL
ALT: 8 IU/L (ref 0–44)
AST: 12 IU/L (ref 0–40)
Albumin/Globulin Ratio: 1.7 (ref 1.2–2.2)
Albumin: 4.3 g/dL (ref 3.5–5.5)
Alkaline Phosphatase: 66 IU/L (ref 39–117)
BUN/Creatinine Ratio: 6 — ABNORMAL LOW (ref 9–20)
BUN: 5 mg/dL — ABNORMAL LOW (ref 6–24)
Bilirubin Total: 0.2 mg/dL (ref 0.0–1.2)
CO2: 24 mmol/L (ref 20–29)
Calcium: 9.4 mg/dL (ref 8.7–10.2)
Chloride: 103 mmol/L (ref 96–106)
Creatinine, Ser: 0.86 mg/dL (ref 0.76–1.27)
GFR calc Af Amer: 115 mL/min/{1.73_m2} (ref >59)
GFR calc non Af Amer: 100 mL/min/{1.73_m2} (ref >59)
Globulin, Total: 2.6 g/dL (ref 1.5–4.5)
Glucose: 95 mg/dL (ref 65–99)
Potassium: 4 mmol/L (ref 3.5–5.2)
Sodium: 142 mmol/L (ref 134–144)
Total Protein: 6.9 g/dL (ref 6.0–8.5)

## 2018-05-06 LAB — LIPID PANEL
Chol/HDL Ratio: 2.7 ratio (ref 0.0–5.0)
Cholesterol, Total: 116 mg/dL (ref 100–199)
HDL: 43 mg/dL (ref >39)
LDL Calculated: 58 mg/dL (ref 0–99)
Triglycerides: 73 mg/dL (ref 0–149)
VLDL Cholesterol Cal: 15 mg/dL (ref 5–40)

## 2018-05-10 ENCOUNTER — Other Ambulatory Visit: Payer: Self-pay | Admitting: Urology

## 2018-05-18 ENCOUNTER — Encounter (HOSPITAL_BASED_OUTPATIENT_CLINIC_OR_DEPARTMENT_OTHER): Payer: Self-pay | Admitting: *Deleted

## 2018-05-18 ENCOUNTER — Other Ambulatory Visit: Payer: Self-pay

## 2018-05-18 NOTE — Progress Notes (Signed)
Spoke w/ pt via phone for pre-op interview.  Pt verbalized understanding npo after mn w/ exception sips of water with lipitor, norvasc, and advair inhaler.  Arrive at [facility].  Needs istat.  Current ekg in chart and epic.  Asked pt to bring rescue inhaler.  Pt verbalized understanding he is have responsible person to pick him up at discharge and they will need to come in and speak with nurse, also recommended for someone to be with him for 24 hours after surgery, no driving, or making important decisions.   Pt stated either his daughter or his uncle to pick up.

## 2018-05-22 NOTE — H&P (Signed)
HPI: Connor Oneal is a 52 year-old male who presents today for circumcision.  He has not had inflammation of the head of his penis. He has not had to use creams on the lesion. He has not had difficulties retracting his foreskin.   He has not had cracking of his penile skin. He does not have tethering of his penile skin. He does not have bleeding of his penile skin.   He would like to be circumcised.   04/28/18: He indicates that he desires a circumcision. When asked why he indicated that he often times per got to wash under the foreskin and he said after intercourse he will shower and then he said notes a odor as if he had just engaged in intercourse despite showering. He has never had an infection beneath his foreskin. He has no difficulty retracting his foreskin.     ALLERGIES: None   MEDICATIONS: Amlodipine Besilate  Atorvastatin Calcium     GU PSH: None   NON-GU PSH: None   GU PMH: None   NON-GU PMH: Hypercholesterolemia Hypertension Inflammatory liver disease, unspecified    FAMILY HISTORY: None   SOCIAL HISTORY: Marital Status: Single Preferred Language: English; Ethnicity: Not Hispanic Or Latino; Race: Black or African American Current Smoking Status: Patient smokes.   Tobacco Use Assessment Completed: Used Tobacco in last 30 days? Does not drink anymore.  Drinks 4+ caffeinated drinks per day.    REVIEW OF SYSTEMS:    GU Review Male:   Patient reports frequent urination and get up at night to urinate. Patient denies hard to postpone urination, burning/ pain with urination, leakage of urine, stream starts and stops, trouble starting your stream, have to strain to urinate , erection problems, and penile pain.  Gastrointestinal (Upper):   Patient denies nausea, vomiting, and indigestion/ heartburn.  Gastrointestinal (Lower):   Patient denies diarrhea and constipation.  Constitutional:   Patient denies fever, night sweats, weight loss, and fatigue.  Skin:   Patient  denies skin rash/ lesion and itching.  Eyes:   Patient denies blurred vision and double vision.  Ears/ Nose/ Throat:   Patient denies sore throat and sinus problems.  Hematologic/Lymphatic:   Patient denies easy bruising and swollen glands.  Cardiovascular:   Patient denies leg swelling and chest pains.  Respiratory:   Patient denies cough and shortness of breath.  Endocrine:   Patient denies excessive thirst.  Musculoskeletal:   Patient denies back pain and joint pain.  Neurological:   Patient denies headaches and dizziness.  Psychologic:   Patient denies depression and anxiety.   VITAL SIGNS:    Weight 193 lb / 87.54 kg  Height 193 in / 490.22 cm  BP 118/76 mmHg  Pulse 68 /min  BMI 3.6 kg/m   GU PHYSICAL EXAMINATION:    Scrotum: No lesions. No edema. No cysts. No warts.  Epididymides: Right: no spermatocele, no masses, no cysts, no tenderness, no induration, no enlargement. Left: no spermatocele, no masses, no cysts, no tenderness, no induration, no enlargement.  Testes: No tenderness, no swelling, no enlargement left testes. No tenderness, no swelling, no enlargement right testes. Normal location left testes. Normal location right testes. No mass, no cyst, no varicocele, no hydrocele left testes. No mass, no cyst, no varicocele, no hydrocele right testes.  Urethral Meatus: Normal size. No lesion, no wart, no discharge, no polyp. Normal location.  Penis: Penis uncircumcised. No foreskin warts, no cracks. No dorsal peyronie's plaques, no left corporal peyronie's plaques, no right corporal peyronie's  plaques, no scarring, no shaft warts. No balanitis, no meatal stenosis.    MULTI-SYSTEM PHYSICAL EXAMINATION:    Constitutional: Well-nourished. No physical deformities. Normally developed. Good grooming.  Neck: Neck symmetrical, not swollen. Normal tracheal position.  Respiratory: No labored breathing, no use of accessory muscles.   Cardiovascular: Normal temperature, normal extremity  pulses, no swelling, no varicosities.  Lymphatic: No enlargement of neck, axillae, groin.  Skin: No paleness, no jaundice, no cyanosis. No lesion, no ulcer, no rash.  Neurologic / Psychiatric: Oriented to time, oriented to place, oriented to person. No depression, no anxiety, no agitation.  Gastrointestinal: No mass, no tenderness, no rigidity, non obese abdomen.  Eyes: Normal conjunctivae. Normal eyelids.  Ears, Nose, Mouth, and Throat: Left ear no scars, no lesions, no masses. Right ear no scars, no lesions, no masses. Nose no scars, no lesions, no masses. Normal hearing. Normal lips.  Musculoskeletal: Normal gait and station of head and neck.     PAST DATA REVIEWED:  Source Of History:  Patient  Lab Test Review:   BUN/Creatinine, Chlam/GC, RPR, Trichomonas  Records Review:   Previous Hospital Records, POC Tool  X-Ray Review: Renal Ultrasound: Reviewed Report. Renal ultrasound 11/15 revealed no abnormality of the kidneys.   Notes:                     STD screening 6/19: RPR, HIV, Chlamydia, GC and Trichomonas negative.  A creatinine in 8/18 was 0.85.   PROCEDURES:          Urinalysis Dipstick Dipstick Cont'd  Color: Yellow Bilirubin: Neg mg/dL  Appearance: Clear Ketones: Neg mg/dL  Specific Gravity: 1.610 Blood: Neg ery/uL  pH: 6.0 Protein: Neg mg/dL  Glucose: Neg mg/dL Urobilinogen: 0.2 mg/dL    Nitrites: Neg    Leukocyte Esterase: Neg leu/uL    ASSESSMENT/PLAN:      ICD-10 Details  1 NON-GU:   Encounter for routine and ritual male circumcision - Z41.2 He desires a circumcision. We discussed the elective nature of this procedure and how procedure is performed including the incision used, the probability of success, the risks and complications as well as the outpatient nature of the procedure and the anticipated postoperative course. He understands and has elected to proceed.

## 2018-05-22 NOTE — Anesthesia Preprocedure Evaluation (Addendum)
Anesthesia Evaluation  Patient identified by MRN, date of birth, ID band Patient awake    Reviewed: Allergy & Precautions, H&P , NPO status , Patient's Chart, lab work & pertinent test results  Airway Mallampati: I  TM Distance: >3 FB Neck ROM: Full    Dental no notable dental hx. (+) Upper Dentures, Partial Lower   Pulmonary COPD, Current Smoker,    Pulmonary exam normal breath sounds clear to auscultation       Cardiovascular Exercise Tolerance: Good hypertension, Pt. on medications + DOE  Normal cardiovascular exam Rhythm:Regular Rate:Normal     Neuro/Psych  Headaches, negative psych ROS   GI/Hepatic Neg liver ROS, GERD  ,Recovery for ETOH abuse   Endo/Other  negative endocrine ROS  Renal/GU negative Renal ROS     Musculoskeletal negative musculoskeletal ROS (+)   Abdominal   Peds  Hematology negative hematology ROS (+)   Anesthesia Other Findings   Reproductive/Obstetrics                          Lab Results  Component Value Date   WBC 10.6 05/05/2018   HGB 13.7 05/05/2018   HCT 41.4 05/05/2018   MCV 81 05/05/2018   PLT 299 05/05/2018    Anesthesia Physical Anesthesia Plan  ASA: II  Anesthesia Plan: General   Post-op Pain Management:    Induction: Intravenous  PONV Risk Score and Plan: 1 and Treatment may vary due to age or medical condition, Ondansetron and Dexamethasone  Airway Management Planned: LMA  Additional Equipment:   Intra-op Plan:   Post-operative Plan:   Informed Consent: I have reviewed the patients History and Physical, chart, labs and discussed the procedure including the risks, benefits and alternatives for the proposed anesthesia with the patient or authorized representative who has indicated his/her understanding and acceptance.   Dental advisory given  Plan Discussed with: CRNA  Anesthesia Plan Comments:         Anesthesia Quick  Evaluation

## 2018-05-23 ENCOUNTER — Ambulatory Visit (HOSPITAL_BASED_OUTPATIENT_CLINIC_OR_DEPARTMENT_OTHER): Payer: Managed Care, Other (non HMO) | Admitting: Anesthesiology

## 2018-05-23 ENCOUNTER — Encounter (HOSPITAL_BASED_OUTPATIENT_CLINIC_OR_DEPARTMENT_OTHER)
Admission: RE | Disposition: A | Discharge status: Home / Self Care | Payer: Self-pay | Source: Ambulatory Visit | Attending: Urology

## 2018-05-23 ENCOUNTER — Ambulatory Visit (HOSPITAL_BASED_OUTPATIENT_CLINIC_OR_DEPARTMENT_OTHER)
Admission: RE | Admit: 2018-05-23 | Discharge: 2018-05-23 | Disposition: A | Discharge status: Home / Self Care | Payer: Managed Care, Other (non HMO) | Source: Ambulatory Visit | Attending: Urology | Admitting: Urology

## 2018-05-23 ENCOUNTER — Encounter (HOSPITAL_BASED_OUTPATIENT_CLINIC_OR_DEPARTMENT_OTHER): Payer: Self-pay | Admitting: Anesthesiology

## 2018-05-23 DIAGNOSIS — Z412 Encounter for routine and ritual male circumcision: Secondary | ICD-10-CM | POA: Insufficient documentation

## 2018-05-23 DIAGNOSIS — K759 Inflammatory liver disease, unspecified: Secondary | ICD-10-CM | POA: Diagnosis not present

## 2018-05-23 DIAGNOSIS — N478 Other disorders of prepuce: Secondary | ICD-10-CM | POA: Diagnosis not present

## 2018-05-23 DIAGNOSIS — E7800 Pure hypercholesterolemia, unspecified: Secondary | ICD-10-CM | POA: Insufficient documentation

## 2018-05-23 DIAGNOSIS — Z79899 Other long term (current) drug therapy: Secondary | ICD-10-CM | POA: Insufficient documentation

## 2018-05-23 DIAGNOSIS — F17200 Nicotine dependence, unspecified, uncomplicated: Secondary | ICD-10-CM | POA: Diagnosis not present

## 2018-05-23 DIAGNOSIS — I10 Essential (primary) hypertension: Secondary | ICD-10-CM | POA: Diagnosis not present

## 2018-05-23 HISTORY — DX: Alcohol dependence, in remission: F10.21

## 2018-05-23 HISTORY — DX: Dyspnea, unspecified: R06.00

## 2018-05-23 HISTORY — PX: CIRCUMCISION: SHX1350

## 2018-05-23 HISTORY — DX: Simple chronic bronchitis: J41.0

## 2018-05-23 HISTORY — DX: Other forms of dyspnea: R06.09

## 2018-05-23 HISTORY — DX: Chronic obstructive pulmonary disease, unspecified: J44.9

## 2018-05-23 HISTORY — DX: Other disorders of prepuce: N47.8

## 2018-05-23 HISTORY — DX: Presence of dental prosthetic device (complete) (partial): Z97.2

## 2018-05-23 HISTORY — DX: Personal history of other infectious and parasitic diseases: Z86.19

## 2018-05-23 HISTORY — DX: Personal history of traumatic brain injury: Z87.820

## 2018-05-23 LAB — POCT I-STAT 4, (NA,K, GLUC, HGB,HCT)
Glucose, Bld: 101 mg/dL — ABNORMAL HIGH (ref 70–99)
HCT: 48 % (ref 39.0–52.0)
Hemoglobin: 16.3 g/dL (ref 13.0–17.0)
Potassium: 3.7 mmol/L (ref 3.5–5.1)
Sodium: 142 mmol/L (ref 135–145)

## 2018-05-23 SURGERY — CIRCUMCISION, ADULT
Anesthesia: General | Site: Penis

## 2018-05-23 MED ORDER — LACTATED RINGERS IV SOLN
INTRAVENOUS | Status: DC
  Administered 2018-05-23 (×2): via INTRAVENOUS
  Filled 2018-05-23: qty 1000

## 2018-05-23 MED ORDER — BACITRACIN-NEOMYCIN-POLYMYXIN 400-5-5000 EX OINT
TOPICAL_OINTMENT | CUTANEOUS | Status: DC | PRN
  Administered 2018-05-23: 1 via TOPICAL

## 2018-05-23 MED ORDER — CEFAZOLIN SODIUM-DEXTROSE 2-4 GM/100ML-% IV SOLN
INTRAVENOUS | Status: AC
  Filled 2018-05-23: qty 100

## 2018-05-23 MED ORDER — CEFAZOLIN SODIUM-DEXTROSE 2-4 GM/100ML-% IV SOLN
2.0000 g | Freq: Once | INTRAVENOUS | Status: AC
  Administered 2018-05-23: 2 g via INTRAVENOUS
  Filled 2018-05-23: qty 100

## 2018-05-23 MED ORDER — KETOROLAC TROMETHAMINE 30 MG/ML IJ SOLN
INTRAMUSCULAR | Status: AC
  Filled 2018-05-23: qty 1

## 2018-05-23 MED ORDER — ACETAMINOPHEN 500 MG PO TABS
ORAL_TABLET | ORAL | Status: AC
  Filled 2018-05-23: qty 2

## 2018-05-23 MED ORDER — LIDOCAINE 2% (20 MG/ML) 5 ML SYRINGE
INTRAMUSCULAR | Status: AC
  Filled 2018-05-23: qty 5

## 2018-05-23 MED ORDER — EPHEDRINE 5 MG/ML INJ
INTRAVENOUS | Status: AC
  Filled 2018-05-23: qty 10

## 2018-05-23 MED ORDER — ONDANSETRON HCL 4 MG/2ML IJ SOLN
INTRAMUSCULAR | Status: AC
  Filled 2018-05-23: qty 2

## 2018-05-23 MED ORDER — DEXAMETHASONE SODIUM PHOSPHATE 10 MG/ML IJ SOLN
INTRAMUSCULAR | Status: AC
  Filled 2018-05-23: qty 1

## 2018-05-23 MED ORDER — HYDROCODONE-ACETAMINOPHEN 7.5-325 MG PO TABS
1.0000 | ORAL_TABLET | Freq: Once | ORAL | Status: DC | PRN
  Filled 2018-05-23: qty 1

## 2018-05-23 MED ORDER — MEPERIDINE HCL 25 MG/ML IJ SOLN
6.2500 mg | INTRAMUSCULAR | Status: DC | PRN
  Filled 2018-05-23: qty 1

## 2018-05-23 MED ORDER — PROPOFOL 10 MG/ML IV BOLUS
INTRAVENOUS | Status: DC | PRN
  Administered 2018-05-23: 150 mg via INTRAVENOUS

## 2018-05-23 MED ORDER — PROMETHAZINE HCL 25 MG/ML IJ SOLN
6.2500 mg | INTRAMUSCULAR | Status: DC | PRN
  Filled 2018-05-23: qty 1

## 2018-05-23 MED ORDER — ROCURONIUM BROMIDE 10 MG/ML (PF) SYRINGE
PREFILLED_SYRINGE | INTRAVENOUS | Status: AC
  Filled 2018-05-23: qty 10

## 2018-05-23 MED ORDER — DEXAMETHASONE SODIUM PHOSPHATE 10 MG/ML IJ SOLN
INTRAMUSCULAR | Status: DC | PRN
  Administered 2018-05-23: 10 mg via INTRAVENOUS

## 2018-05-23 MED ORDER — LIDOCAINE 2% (20 MG/ML) 5 ML SYRINGE
INTRAMUSCULAR | Status: DC | PRN
  Administered 2018-05-23: 100 mg via INTRAVENOUS

## 2018-05-23 MED ORDER — FENTANYL CITRATE (PF) 100 MCG/2ML IJ SOLN
INTRAMUSCULAR | Status: DC | PRN
  Administered 2018-05-23: 50 ug via INTRAVENOUS

## 2018-05-23 MED ORDER — HYDROCODONE-ACETAMINOPHEN 10-325 MG PO TABS: 1.0000 | ORAL_TABLET | ORAL | 0 refills | Status: DC | PRN

## 2018-05-23 MED ORDER — EPHEDRINE SULFATE 50 MG/ML IJ SOLN
INTRAMUSCULAR | Status: DC | PRN
  Administered 2018-05-23: 5 mg via INTRAVENOUS
  Administered 2018-05-23: 10 mg via INTRAVENOUS

## 2018-05-23 MED ORDER — ACETAMINOPHEN 10 MG/ML IV SOLN
1000.0000 mg | Freq: Once | INTRAVENOUS | Status: DC | PRN
  Filled 2018-05-23: qty 100

## 2018-05-23 MED ORDER — HYDROMORPHONE HCL 1 MG/ML IJ SOLN
0.2500 mg | INTRAMUSCULAR | Status: DC | PRN
  Filled 2018-05-23: qty 0.5

## 2018-05-23 MED ORDER — SUGAMMADEX SODIUM 200 MG/2ML IV SOLN
INTRAVENOUS | Status: AC
  Filled 2018-05-23: qty 2

## 2018-05-23 MED ORDER — FENTANYL CITRATE (PF) 100 MCG/2ML IJ SOLN
INTRAMUSCULAR | Status: AC
  Filled 2018-05-23: qty 4

## 2018-05-23 MED ORDER — PROPOFOL 10 MG/ML IV BOLUS
INTRAVENOUS | Status: AC
  Filled 2018-05-23: qty 40

## 2018-05-23 MED ORDER — SUCCINYLCHOLINE CHLORIDE 200 MG/10ML IV SOSY
PREFILLED_SYRINGE | INTRAVENOUS | Status: AC
  Filled 2018-05-23: qty 10

## 2018-05-23 MED ORDER — ACETAMINOPHEN 500 MG PO TABS
1000.0000 mg | ORAL_TABLET | Freq: Once | ORAL | Status: AC
  Administered 2018-05-23: 1000 mg via ORAL
  Filled 2018-05-23: qty 2

## 2018-05-23 MED ORDER — BUPIVACAINE HCL 0.5 % IJ SOLN
INTRAMUSCULAR | Status: DC | PRN
  Administered 2018-05-23: 10 mL

## 2018-05-23 MED ORDER — MIDAZOLAM HCL 2 MG/2ML IJ SOLN
INTRAMUSCULAR | Status: AC
  Filled 2018-05-23: qty 2

## 2018-05-23 MED ORDER — MIDAZOLAM HCL 2 MG/2ML IJ SOLN
INTRAMUSCULAR | Status: DC | PRN
  Administered 2018-05-23: 2 mg via INTRAVENOUS

## 2018-05-23 SURGICAL SUPPLY — 33 items
BANDAGE CO FLEX L/F 1IN X 5YD (GAUZE/BANDAGES/DRESSINGS) IMPLANT
BANDAGE CO FLEX L/F 2IN X 5YD (GAUZE/BANDAGES/DRESSINGS) ×2 IMPLANT
BANDAGE COBAN STERILE 2 (GAUZE/BANDAGES/DRESSINGS) ×1 IMPLANT
BLADE CLIPPER SURG (BLADE) IMPLANT
BLADE SURG 15 STRL LF DISP TIS (BLADE) ×1 IMPLANT
BLADE SURG 15 STRL SS (BLADE) ×2
COVER BACK TABLE 60X90IN (DRAPES) ×2 IMPLANT
COVER MAYO STAND STRL (DRAPES) ×2 IMPLANT
DRAIN PENROSE 18X1/4 LTX STRL (WOUND CARE) IMPLANT
DRAPE LAPAROTOMY 100X72 PEDS (DRAPES) ×2 IMPLANT
ELECT REM PT RETURN 9FT ADLT (ELECTROSURGICAL) ×2
ELECTRODE REM PT RTRN 9FT ADLT (ELECTROSURGICAL) ×1 IMPLANT
GAUZE SPONGE 4X4 12PLY STRL (GAUZE/BANDAGES/DRESSINGS) ×1 IMPLANT
GLOVE BIO SURGEON STRL SZ8 (GLOVE) ×2 IMPLANT
GOWN STRL REUS W/ TWL LRG LVL3 (GOWN DISPOSABLE) ×1 IMPLANT
GOWN STRL REUS W/ TWL XL LVL3 (GOWN DISPOSABLE) ×1 IMPLANT
GOWN STRL REUS W/TWL LRG LVL3 (GOWN DISPOSABLE) ×2
GOWN STRL REUS W/TWL XL LVL3 (GOWN DISPOSABLE) ×2
IV NS IRRIG 3000ML ARTHROMATIC (IV SOLUTION) IMPLANT
KIT TURNOVER CYSTO (KITS) ×2 IMPLANT
NEEDLE HYPO 22GX1.5 SAFETY (NEEDLE) ×2 IMPLANT
NS IRRIG 500ML POUR BTL (IV SOLUTION) ×1 IMPLANT
PACK BASIN DAY SURGERY FS (CUSTOM PROCEDURE TRAY) ×2 IMPLANT
PENCIL BUTTON HOLSTER BLD 10FT (ELECTRODE) ×2 IMPLANT
SUT CHROMIC 3 0 SH 27 (SUTURE) ×4 IMPLANT
SUT CHROMIC 4 0 RB 1X27 (SUTURE) IMPLANT
SUT CHROMIC 5 0 RB 1 27 (SUTURE) ×2 IMPLANT
SYR CONTROL 10ML LL (SYRINGE) ×2 IMPLANT
TOWEL OR 17X24 6PK STRL BLUE (TOWEL DISPOSABLE) ×4 IMPLANT
TRAY DSU PREP LF (CUSTOM PROCEDURE TRAY) ×2 IMPLANT
TUBE CONNECTING 12X1/4 (SUCTIONS) IMPLANT
WATER STERILE IRR 3000ML UROMA (IV SOLUTION) IMPLANT
WATER STERILE IRR 500ML POUR (IV SOLUTION) ×1 IMPLANT

## 2018-05-23 NOTE — Transfer of Care (Signed)
Immediate Anesthesia Transfer of Care Note  Patient: Connor Oneal  Procedure(s) Performed: CIRCUMCISION ADULT (N/A Penis)  Patient Location: PACU  Anesthesia Type:General  Level of Consciousness: awake, alert , oriented and patient cooperative  Airway & Oxygen Therapy: Patient Spontanous Breathing and Patient connected to nasal cannula oxygen  Post-op Assessment: Report given to RN and Post -op Vital signs reviewed and stable  Post vital signs: Reviewed and stable  Last Vitals:  Vitals Value Taken Time  BP 137/82 05/23/2018 11:12 AM  Temp 36.8 C 05/23/2018 11:12 AM  Pulse 72 05/23/2018 11:12 AM  Resp    SpO2 98 % 05/23/2018 11:12 AM  Vitals shown include unvalidated device data.  Last Pain:  Vitals:   05/23/18 0903  TempSrc:   PainSc: 0-No pain      Patients Stated Pain Goal: 5 (05/23/18 4098)  Complications: No apparent anesthesia complications

## 2018-05-23 NOTE — Anesthesia Procedure Notes (Signed)
Procedure Name: LMA Insertion Date/Time: 05/23/2018 10:34 AM Performed by: Tyrone Nine, CRNA Pre-anesthesia Checklist: Patient identified, Emergency Drugs available, Suction available, Patient being monitored and Timeout performed Patient Re-evaluated:Patient Re-evaluated prior to induction Oxygen Delivery Method: Circle system utilized Preoxygenation: Pre-oxygenation with 100% oxygen Induction Type: IV induction Ventilation: Mask ventilation without difficulty LMA: LMA inserted LMA Size: 4.0 Number of attempts: 1 Placement Confirmation: breath sounds checked- equal and bilateral,  CO2 detector and positive ETCO2 Tube secured with: Tape Dental Injury: Teeth and Oropharynx as per pre-operative assessment

## 2018-05-23 NOTE — Anesthesia Postprocedure Evaluation (Signed)
Anesthesia Post Note  Patient: Temesgen Weightman  Procedure(s) Performed: CIRCUMCISION ADULT (N/A Penis)     Patient location during evaluation: PACU Anesthesia Type: General Level of consciousness: awake and alert Pain management: pain level controlled Vital Signs Assessment: post-procedure vital signs reviewed and stable Respiratory status: spontaneous breathing, nonlabored ventilation, respiratory function stable and patient connected to nasal cannula oxygen Cardiovascular status: blood pressure returned to baseline and stable Postop Assessment: no apparent nausea or vomiting Anesthetic complications: no    Last Vitals:  Vitals:   05/23/18 1145 05/23/18 1220  BP: 133/77 137/80  Pulse: 81 68  Resp: 19 20  Temp:  36.8 C  SpO2: 91% 94%    Last Pain:  Vitals:   05/23/18 1220  TempSrc:   PainSc: 0-No pain                 Trevor Iha

## 2018-05-23 NOTE — Discharge Instructions (Signed)
Postoperative instructions for circumcision ° °Wound: ° °In most cases your incision will have absorbable sutures that run along the course of your incision and will dissolve within the first 10-20 days. Some will fall out even earlier. Expect some redness as the sutures dissolved but this should occur only around the sutures. If there is generalized redness, especially with increasing pain or swelling, let us know. The penis will very likely get "black and blue" as the blood in the tissues spread. Sometimes the whole penis will turn colors. The black and blue is followed by a yellow and brown color. In time, all the discoloration will go away. ° °Diet: ° °You may return to your normal diet within 24 hours following your surgery. You may note some mild nausea and possibly vomiting the first 6-8 hours following surgery. This is usually due to the side effects of anesthesia, and will disappear quite soon. I would suggest clear liquids and a very light meal the first evening following your surgery. ° °Activity: ° °Your physical activity should be restricted the first 48 hours. During that time you should remain relatively inactive, moving about only when necessary. During the first 7-10 days following surgery he should avoid lifting any heavy objects (anything greater than 15 pounds), and avoid strenuous exercise. If you work, ask us specifically about your restrictions, both for work and home. We will write a note to your employer if needed. ° °Ice packs can be placed on and off over the penis for the first 48 hours to help relieve the pain and keep the swelling down. Frozen peas or corn in a ZipLock bag can be frozen, used and re-frozen. Fifteen minutes on and 15 minutes off is a reasonable schedule.  ° °Hygiene: ° °You may shower 48 hours after your surgery. Tub bathing should be restricted until the seventh day. ° °Medication: ° °You will be sent home with some type of pain medication. In many cases you will be  sent home with a narcotic pain pill (Vicodin or Tylox). If the pain is not too bad, you may take either Tylenol (acetaminophen) or Advil (ibuprofen) which contain no narcotic agents, and might be tolerated a little better, with fewer side effects. If the pain medication you are sent home with does not control the pain, you will have to let us know. Some narcotic pain medications cannot be given or refilled by a phone call to a pharmacy. ° °Problems you should report to us: ° °· Fever of 101.0 degrees Fahrenheit or greater. °· Moderate or severe swelling under the skin incision or involving the scrotum. °· Drug reaction such as hives, a rash, nausea or vomiting.  ° ° ° ° °Post Anesthesia Home Care Instructions ° °Activity: °Get plenty of rest for the remainder of the day. A responsible adult should stay with you for 24 hours following the procedure.  °For the next 24 hours, DO NOT: °-Drive a car °-Operate machinery °-Drink alcoholic beverages °-Take any medication unless instructed by your physician °-Make any legal decisions or sign important papers. ° °Meals: °Start with liquid foods such as gelatin or soup. Progress to regular foods as tolerated. Avoid greasy, spicy, heavy foods. If nausea and/or vomiting occur, drink only clear liquids until the nausea and/or vomiting subsides. Call your physician if vomiting continues. ° °Special Instructions/Symptoms: °Your throat may feel dry or sore from the anesthesia or the breathing tube placed in your throat during surgery. If this causes discomfort, gargle with warm salt water.   The discomfort should disappear within 24 hours. ° °If you had a scopolamine patch placed behind your ear for the management of post- operative nausea and/or vomiting: ° °1. The medication in the patch is effective for 72 hours, after which it should be removed.  Wrap patch in a tissue and discard in the trash. Wash hands thoroughly with soap and water. °2. You may remove the patch earlier than 72  hours if you experience unpleasant side effects which may include dry mouth, dizziness or visual disturbances. °3. Avoid touching the patch. Wash your hands with soap and water after contact with the patch. °  ° °

## 2018-05-23 NOTE — Op Note (Signed)
PATIENT:  Connor Oneal  PRE-OPERATIVE DIAGNOSIS: Redundant foreskin  POST-OPERATIVE DIAGNOSIS: Same  PROCEDURE: Circumcision  SURGEON:  Garnett Farm  INDICATION: Demetrice Combes is a 52 year old male who desires circumcision.  We discussed the elective nature of the procedure as well as its risks and complications.  He understands and is elected to proceed.  ANESTHESIA:  General  EBL:  Minimal  DRAINS: None  LOCAL MEDICATIONS USED: 10 cc of 1/2 percent Marcaine as a dorsal penile block.  SPECIMEN:    Description of procedure: After informed consent the patient was taken to the operating room and placed on the table in a supine position. General anesthesia was then administered.  His genitalia was sterilely prepped and draped.  An official timeout was then performed.  A dorsal penile block was performed in the standard fashion.  I then marked on the shaft skin the location of the glans beneath and then retracted the foreskin and made a circumcising incision circumferentially approximately 5 mm from the corona.  I then replaced the foreskin in its normal anatomic position and excised along the previously drawn lines.  I used sharp dissection to excise the foreskin and then used electrocautery to fulgurate any bleeders.  There are chromic suture was used to place a U stitch in the area of the frenulum and a second 3-0 chromic was placed at the 12 o'clock position.  Each of these was then used to reapproximate the skin edges in a running fashion.  I then applied Neosporin and gently wrapped the penis with folded 4 x 4 gauze x2.  Coban was applied and the patient was awakened and taken to the recovery room in stable and satisfactory condition.  He tolerated the procedure well and there were no intraoperative complications.   PLAN OF CARE: Discharge to home after PACU  PATIENT DISPOSITION:  PACU - hemodynamically stable.

## 2018-05-24 ENCOUNTER — Encounter (HOSPITAL_BASED_OUTPATIENT_CLINIC_OR_DEPARTMENT_OTHER): Payer: Self-pay | Admitting: Urology

## 2018-10-13 ENCOUNTER — Ambulatory Visit: Payer: Managed Care, Other (non HMO) | Admitting: Physician Assistant

## 2018-10-13 ENCOUNTER — Other Ambulatory Visit: Payer: Self-pay

## 2018-10-13 NOTE — Progress Notes (Signed)
Patient ID: Connor Oneal, male   DOB: 1966/04/22, 53 y.o.   MRN: [id number]

## 2018-10-25 ENCOUNTER — Other Ambulatory Visit: Payer: Self-pay

## 2018-10-25 ENCOUNTER — Emergency Department (HOSPITAL_COMMUNITY)
Admission: EM | Admit: 2018-10-25 | Discharge: 2018-10-25 | Disposition: A | Discharge status: Home / Self Care | Payer: Managed Care, Other (non HMO) | Attending: Emergency Medicine | Admitting: Emergency Medicine

## 2018-10-25 DIAGNOSIS — J449 Chronic obstructive pulmonary disease, unspecified: Secondary | ICD-10-CM | POA: Insufficient documentation

## 2018-10-25 DIAGNOSIS — R51 Headache: Secondary | ICD-10-CM | POA: Diagnosis not present

## 2018-10-25 DIAGNOSIS — F17210 Nicotine dependence, cigarettes, uncomplicated: Secondary | ICD-10-CM | POA: Diagnosis not present

## 2018-10-25 DIAGNOSIS — I10 Essential (primary) hypertension: Secondary | ICD-10-CM | POA: Insufficient documentation

## 2018-10-25 DIAGNOSIS — Z79899 Other long term (current) drug therapy: Secondary | ICD-10-CM | POA: Diagnosis not present

## 2018-10-25 DIAGNOSIS — R519 Headache, unspecified: Secondary | ICD-10-CM
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
## 2018-10-25 NOTE — Discharge Instructions (Addendum)
Today your blood pressure was not high enough to require emergency treatment.  Please follow-up with your primary care doctor.  If your headache does not improve please seek additional medical care and evaluation.

## 2018-10-25 NOTE — ED Triage Notes (Signed)
Pt here for headache since yesterday but says he's not in pain now. Sts he thought he had a fever but is afebrile. Pt sts he has a chronic cough from smoking but yesterday coughed up blood one time and some blood came out when he blew his nose. Has not taken any medication pta.

## 2018-11-29 ENCOUNTER — Encounter: Payer: Managed Care, Other (non HMO) | Admitting: Internal Medicine

## 2018-12-06 ENCOUNTER — Ambulatory Visit: Payer: Managed Care, Other (non HMO) | Attending: Internal Medicine | Admitting: Internal Medicine

## 2018-12-06 ENCOUNTER — Encounter: Payer: Self-pay | Admitting: Internal Medicine

## 2018-12-06 ENCOUNTER — Other Ambulatory Visit: Payer: Self-pay

## 2018-12-06 VITALS — BP 150/83 | HR 65 | Temp 98.0°F | Resp 16 | Ht 68.0 in | Wt 184.8 lb

## 2018-12-06 DIAGNOSIS — I10 Essential (primary) hypertension: Secondary | ICD-10-CM | POA: Diagnosis not present

## 2018-12-06 DIAGNOSIS — R519 Headache, unspecified: Secondary | ICD-10-CM

## 2018-12-06 DIAGNOSIS — F17200 Nicotine dependence, unspecified, uncomplicated: Secondary | ICD-10-CM | POA: Diagnosis not present

## 2018-12-06 DIAGNOSIS — R0683 Snoring: Secondary | ICD-10-CM

## 2018-12-06 DIAGNOSIS — Z114 Encounter for screening for human immunodeficiency virus [HIV]: Secondary | ICD-10-CM

## 2018-12-06 DIAGNOSIS — J449 Chronic obstructive pulmonary disease, unspecified: Secondary | ICD-10-CM

## 2018-12-06 DIAGNOSIS — Z0000 Encounter for general adult medical examination without abnormal findings: Secondary | ICD-10-CM | POA: Diagnosis not present

## 2018-12-06 DIAGNOSIS — R51 Headache: Secondary | ICD-10-CM

## 2018-12-06 DIAGNOSIS — Z125 Encounter for screening for malignant neoplasm of prostate: Secondary | ICD-10-CM

## 2018-12-06 DIAGNOSIS — H5210 Myopia, unspecified eye: Secondary | ICD-10-CM

## 2018-12-06 MED ORDER — AMLODIPINE BESYLATE 10 MG PO TABS: 10.0000 mg | ORAL_TABLET | Freq: Every day | ORAL | 2 refills | Status: DC

## 2018-12-06 MED ORDER — NICOTINE POLACRILEX 2 MG MT LOZG: 2.0000 mg | LOZENGE | OROMUCOSAL | 0 refills | Status: DC | PRN

## 2018-12-06 NOTE — Patient Instructions (Addendum)
Increase amlodipine to 10 mg daily. You have been referred for an eye exam and for home sleep study. I encourage you to set a quit date to discontinue smoking.

## 2018-12-06 NOTE — Progress Notes (Signed)
Patient ID: Connor Oneal, male    DOB: [birth year]  MRN: [id number]  CC: Annual Exam   Subjective: Connor Oneal is a 53 y.o. male who presents for annual exam  His concerns today include:  53 year old male with history of hepatitis C that has been cured withHarvonias of 2016, COPD, tob depen, HL and HTN.  HYPERTENSION Currently taking: see medication list Med Adherence:  Yes     No Medication side effects:  Yes     No Adherence with salt restriction:  Yes     No Home Monitoring?:  Yes     No SOB?  Yes which he attributes to smoking.  Notices SOB when picking up heavy stuff at work.  Works as a Psychologist, occupational     Chest Pain?:  Yes     No Leg swelling?:  Yes     No Headaches?:  Yes - not too often but some mornings when he wakes up.  Feels due to elev BP    Dizziness?  Yes     No Comments:   Endorses morning HA about 2 x a wk. Told that he snores loud by his friend.  Does not feel refresh when he wakes up in the mornings and has a lot of daytime sleepiness.  Never diagnosed with OSA  COPD:  Not using inhalers.  States that it is been several months since he used them.  -"coughing fit" in early mornings where he brings up little phlegm.  Phlegm black in color sometimes.  He attributes this to inhaling dust particles at work.  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  His job does provide mask for them to wear if they want to -endorses rhinorrhea and sneezing.  No itchy eyes.   -smoking more about lately about 11/4 a day.  Smoked for over 30 yrs.  Quit for 11 mths while incarcerated.  He started when he was released from prison because "I love to smoke when I drink."  However he reports that he has quit drinking completely but still finding it hard to stop smoking.  He is ready to quit now.  Used Chantix in the past and did not find it helpful. Nicotine patches "burnt my skin."   Feels the only way he  will quit is to isolated for 7 days.   -  Health maintenance reviewed.  He has never had a PSA for prostate cancer screening.  He requests HIV screening.   Patient Active Problem List   Diagnosis Date Noted   Liver fibrosis 10/21/2017   Hyperlipidemia 03/10/2017   HTN (hypertension) 03/12/2014   Tobacco abuse 09/25/2011   COPD (chronic obstructive pulmonary disease) (HCC) 09/08/2011   Hepatitis C virus infection cured after antiviral drug therapy 04/22/2011   Alcoholism in remission (HCC) 04/22/2011     Current Outpatient Medications on File Prior to Visit  Medication Sig Dispense Refill   albuterol (PROVENTIL HFA;VENTOLIN HFA) 108 (90 Base) MCG/ACT inhaler Inhale 2 puffs into the lungs every 6 (six) hours as needed for wheezing or shortness of breath. 54 Inhaler 5   amLODipine (NORVASC) 5 MG tablet Take 1 tablet (5 mg total) by mouth daily. MUST MAKE APPT FOR FURTHER REFILLS (Patient taking differently: Take 5 mg by mouth every morning. MUST MAKE APPT FOR FURTHER REFILLS) 30 tablet 11   atorvastatin (LIPITOR) 10 MG tablet Take 1 tablet (10 mg total) by mouth daily. (Patient taking differently: Take 10  mg by mouth every morning. ) 30 tablet 11   Fluticasone-Salmeterol (ADVAIR) 100-50 MCG/DOSE AEPB Inhale 1 puff into the lungs 2 (two) times daily. (Patient taking differently: Inhale 1 puff into the lungs 2 (two) times daily as needed. ) 180 each 5   HYDROcodone-acetaminophen (NORCO) 10-325 MG tablet Take 1-2 tablets by mouth every 4 (four) hours as needed for moderate pain. Maximum dose per 24 hours - 8 pills (Patient not taking: Reported on 10/13/2018) 20 tablet 0   Multiple Vitamin (MULTIVITAMIN) tablet Take 1 tablet by mouth daily.     No current facility-administered medications on file prior to visit.     No Known Allergies  Social History   Socioeconomic History   Marital status: Single    Spouse name: Not on file   Number of children: Not on file   Years of  education: Not on file   Highest education level: Not on file  Occupational History   Not on file  Social Needs   Financial resource strain: Not on file   Food insecurity:    Worry: Not on file    Inability: Not on file   Transportation needs:    Medical: Not on file    Non-medical: Not on file  Tobacco Use   Smoking status: Current Every Day Smoker    Packs/day: 1.00    Years: 30.00    Pack years: 30.00    Types: Cigarettes   Smokeless tobacco: Never Used  Substance and Sexual Activity   Alcohol use: No    Alcohol/week: 0.0 standard drinks    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   Drug use: Not Currently    Comment: 11-30-2007  quit   Sexual activity: Yes  Lifestyle   Physical activity:    Days per week: Not on file    Minutes per session: Not on file   Stress: Not on file  Relationships   Social connections:    Talks on phone: Not on file    Gets together: Not on file    Attends religious service: Not on file    Active member of club or organization: Not on file    Attends meetings of clubs or organizations: Not on file    Relationship status: Not on file   Intimate partner violence:    Fear of current or ex partner: Not on file    Emotionally abused: Not on file    Physically abused: Not on file    Forced sexual activity: Not on file  Other Topics Concern   Not on file  Social History Narrative   Patient has unprotected sex with multiple partners within a months time    Family History  Problem Relation Age of Onset   COPD Mother        smoker   COPD Father        smoker--Lung transplant    Past Surgical History:  Procedure Laterality Date   CIRCUMCISION N/A 05/23/2018   Procedure: CIRCUMCISION ADULT;  Surgeon: Ihor Gully, MD;  Location: [facility];  Service: Urology;  Laterality: N/A;  ONLY NEEDS 45 MIN   LIVER BIOPSY  2015    ROS: Review of Systems  Constitutional: Negative for activity change, appetite  change, fever and unexpected weight change.  HENT: Negative for congestion, hearing loss and sore throat.   Eyes: Positive for visual disturbance (problems seeing things that are at a distance.  Last eye exam was more than 2 yrs ago).  Respiratory:  Negative for chest tightness.   Cardiovascular: Negative for chest pain, palpitations and leg swelling.  Gastrointestinal: Negative for abdominal pain and blood in stool.  Genitourinary: Negative for difficulty urinating.  Musculoskeletal: Negative for arthralgias and joint swelling.  Skin: Negative for rash.  Neurological: Negative for dizziness.  Psychiatric/Behavioral: Negative for dysphoric mood.   Negative except as stated above  PHYSICAL EXAM: BP (!) 150/83    Pulse 65    Temp 98 F (36.7 C) (Oral)    Resp 16    Ht 5\' 8"  (1.727 m)    Wt 184 lb 12.8 oz (83.8 kg)    SpO2 98%    BMI 28.10 kg/m   Physical Exam General appearance - alert, well appearing, middle-aged African-American male and in no distress Mental status - normal mood, behavior, speech, dress, motor activity, and thought processes Eyes - pupils equal and reactive, extraocular eye movements intact Ears - bilateral TM's and external ear canals normal Nose - normal and patent, no erythema, discharge or polyps Mouth - mucous membranes moist, pharynx normal without lesions Neck - supple, no significant adenopathy Lymphatics - no palpable lymphadenopathy, no hepatosplenomegaly Chest - clear to auscultation, no wheezes, rales or rhonchi, symmetric air entry Heart - normal rate, regular rhythm, normal S1, S2, no murmurs, rubs, clicks or gallops Abdomen - soft, nontender, nondistended, no masses or organomegaly Musculoskeletal - no joint tenderness, deformity or swelling Extremities - peripheral pulses normal, no pedal edema, no clubbing or cyanosis Skin -some spider veins noted on the lower extremities and feet  CMP Latest Ref Rng & Units 05/23/2018 05/05/2018 03/05/2017  Glucose  70 - 99 mg/dL 161(W101(H) 95 89  BUN 6 - 24 mg/dL - 5(L) 7  Creatinine 9.600.76 - 1.27 mg/dL - 4.540.86 0.980.85  Sodium 119135 - 145 mmol/L 142 142 144  Potassium 3.5 - 5.1 mmol/L 3.7 4.0 4.5  Chloride 96 - 106 mmol/L - 103 107(H)  CO2 20 - 29 mmol/L - 24 22  Calcium 8.7 - 10.2 mg/dL - 9.4 9.6  Total Protein 6.0 - 8.5 g/dL - 6.9 6.9  Total Bilirubin 0.0 - 1.2 mg/dL - 0.2 <1.4<0.2  Alkaline Phos 39 - 117 IU/L - 66 50  AST 0 - 40 IU/L - 12 13  ALT 0 - 44 IU/L - 8 10   Lipid Panel     Component Value Date/Time   CHOL 116 05/05/2018 1459   TRIG 73 05/05/2018 1459   HDL 43 05/05/2018 1459   CHOLHDL 2.7 05/05/2018 1459   CHOLHDL 4 01/11/2014 1617   VLDL 16.8 01/11/2014 1617   LDLCALC 58 05/05/2018 1459    CBC    Component Value Date/Time   WBC 10.6 05/05/2018 1459   WBC 7.2 10/31/2014 1049   RBC 5.10 05/05/2018 1459   RBC 5.27 10/31/2014 1049   HGB 16.3 05/23/2018 0919   HGB 13.7 05/05/2018 1459   HCT 48.0 05/23/2018 0919   HCT 41.4 05/05/2018 1459   PLT 299 05/05/2018 1459   MCV 81 05/05/2018 1459   MCH 26.9 05/05/2018 1459   MCH 28.3 10/31/2014 1049   MCHC 33.1 05/05/2018 1459   MCHC 34.1 10/31/2014 1049   RDW 13.2 05/05/2018 1459   LYMPHSABS 2.7 10/31/2014 1049   MONOABS 0.4 10/31/2014 1049   EOSABS 0.5 10/31/2014 1049   BASOSABS 0.1 10/31/2014 1049    ASSESSMENT AND PLAN: 1. Annual physical exam Patient is up-to-date with age-appropriate vaccines and colon cancer screening.  We discussed prostate cancer screening using  a PSA.  We discussed the limitations of using the PSA.  Patient was still agreeable to having a PSA drawn. -Discussed the importance of healthy eating habits and regular exercise.  Encouraged him to get in some exercise activity outside of work at least 3 to 4 days a week for 30 minutes.  2. Tobacco dependence Patient advised to quit smoking. Discussed health risks associated with smoking including lung and other types of cancers, chronic lung diseases and CV  risks.. Pt ready to give trail of quitting.  Discussed methods to help quit including quitting cold [place], use of NRT, Chantix and Bupropion.  Patient wanting to try the nicotine lozenges - nicotine polacrilex (SM NICOTINE) 2 MG lozenge; Take 1 lozenge (2 mg total) by mouth as needed for smoking cessation.  Dispense: 100 tablet; Refill: 0  3. Chronic obstructive pulmonary disease, unspecified COPD type (HCC) Not using Symbicort or albuterol.  I advised that he can use the albuterol in the mornings when he has his coughing spells. Encouraged him to wear a mask when performing his duties at work  4. Essential hypertension Not at goal.  Increase amlodipine to 10 mg - amLODipine (NORVASC) 10 MG tablet; Take 1 tablet (10 mg total) by mouth daily.  Dispense: 11 tablet; Refill: 2  5. Loud snoring 6. Morning headache Symptoms of loud snoring and morning headaches and daytime sleepiness very suggestive of OSA.  Patient agreeable to home sleep study - Home sleep test  7. Myopia, unspecified laterality - Ambulatory referral to Ophthalmology  8. Prostate cancer screening See #1 above - PSA  9. Screening for HIV (human immunodeficiency virus) - HIV Antibody (routine testing w rflx)  Patient was given the opportunity to ask questions.  Patient verbalized understanding of the plan and was able to repeat key elements of the plan.   No orders of the defined types were placed in this encounter.    Requested Prescriptions    No prescriptions requested or ordered in this encounter    No follow-ups on file.  Jonah Blue, MD, FACP

## 2018-12-06 NOTE — Progress Notes (Signed)
Pt is needing a form filled out for insurance  Pt states he has been having some left knee discomfort  Pt states he think his bp medicine needs to be increase because he has been having headaches lately

## 2018-12-07 LAB — HIV ANTIBODY (ROUTINE TESTING W REFLEX): HIV Screen 4th Generation wRfx: NONREACTIVE

## 2018-12-07 LAB — PSA: Prostate Specific Ag, Serum: 0.7 ng/mL (ref 0.0–4.0)

## 2019-01-18 ENCOUNTER — Telehealth: Payer: Self-pay | Admitting: Internal Medicine

## 2019-01-18 DIAGNOSIS — I10 Essential (primary) hypertension: Secondary | ICD-10-CM

## 2019-01-18 NOTE — Telephone Encounter (Signed)
New Message   Pt states that he is only receiving 22 pills of his Amlodipine and would like to get 30 instead. Please f/u

## 2019-01-19 MED ORDER — AMLODIPINE BESYLATE 10 MG PO TABS: 10.0000 mg | ORAL_TABLET | Freq: Every day | ORAL | 2 refills | Status: DC

## 2019-01-19 NOTE — Telephone Encounter (Signed)
Attempted to call patient to inform him, there was no answer and vm was not set up.

## 2019-01-19 NOTE — Telephone Encounter (Signed)
Pt only received a 11 day supply prior which was just enough to last until his appt and wasn't updated when reordered. The rx has been resent to [facility] for a 30 day supply.

## 2019-01-19 NOTE — Telephone Encounter (Signed)
Will forward to [facility]. Could you please look into this

## 2019-01-30 ENCOUNTER — Encounter: Payer: Managed Care, Other (non HMO) | Admitting: Internal Medicine
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
## 2019-04-11 ENCOUNTER — Ambulatory Visit: Payer: Managed Care, Other (non HMO) | Admitting: Internal Medicine

## 2019-04-14 ENCOUNTER — Telehealth: Payer: Self-pay | Admitting: Internal Medicine

## 2019-04-14 DIAGNOSIS — E785 Hyperlipidemia, unspecified: Secondary | ICD-10-CM

## 2019-04-14 DIAGNOSIS — I10 Essential (primary) hypertension: Secondary | ICD-10-CM

## 2019-04-14 MED ORDER — AMLODIPINE BESYLATE 10 MG PO TABS: 10.0000 mg | ORAL_TABLET | Freq: Every day | ORAL | 0 refills | Status: DC

## 2019-04-14 NOTE — Telephone Encounter (Signed)
New Message  1) Medication(s) Requested (by name): amLODipine (NORVASC) 10 MG tablet  2) Pharmacy of Choice: [facility] on [address]  3) Special Requests:   Approved medications will be sent to the pharmacy, we will reach out if there is an issue.  Requests made after 3pm may not be addressed until the following business day!  If a patient is unsure of the name of the medication(s) please note and ask patient to call back when they are able to provide all info, do not send to responsible party until all information is available!

## 2019-07-06 ENCOUNTER — Encounter (HOSPITAL_COMMUNITY): Payer: Self-pay | Admitting: Emergency Medicine

## 2019-07-06 ENCOUNTER — Other Ambulatory Visit: Payer: Self-pay

## 2019-07-06 ENCOUNTER — Emergency Department (HOSPITAL_COMMUNITY)
Admission: EM | Admit: 2019-07-06 | Discharge: 2019-07-06 | Disposition: A | Discharge status: Home / Self Care | Payer: Managed Care, Other (non HMO) | Attending: Emergency Medicine | Admitting: Emergency Medicine

## 2019-07-06 DIAGNOSIS — M542 Cervicalgia: Secondary | ICD-10-CM | POA: Diagnosis not present

## 2019-07-06 DIAGNOSIS — M545 Low back pain: Secondary | ICD-10-CM | POA: Insufficient documentation

## 2019-07-06 DIAGNOSIS — Z79899 Other long term (current) drug therapy: Secondary | ICD-10-CM | POA: Insufficient documentation

## 2019-07-06 DIAGNOSIS — E785 Hyperlipidemia, unspecified: Secondary | ICD-10-CM | POA: Insufficient documentation

## 2019-07-06 DIAGNOSIS — F17210 Nicotine dependence, cigarettes, uncomplicated: Secondary | ICD-10-CM | POA: Diagnosis not present

## 2019-07-06 DIAGNOSIS — I10 Essential (primary) hypertension: Secondary | ICD-10-CM | POA: Diagnosis not present

## 2019-07-06 DIAGNOSIS — J449 Chronic obstructive pulmonary disease, unspecified: Secondary | ICD-10-CM | POA: Diagnosis not present

## 2019-07-06 MED ORDER — METHOCARBAMOL 750 MG PO TABS: 750.0000 mg | ORAL_TABLET | Freq: Four times a day (QID) | ORAL | 0 refills | Status: DC
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
## 2019-07-24 ENCOUNTER — Telehealth: Payer: Self-pay | Admitting: Internal Medicine

## 2019-07-24 DIAGNOSIS — I10 Essential (primary) hypertension: Secondary | ICD-10-CM

## 2019-07-24 MED ORDER — AMLODIPINE BESYLATE 10 MG PO TABS: 10.0000 mg | ORAL_TABLET | Freq: Every day | ORAL | 0 refills | Status: DC

## 2019-07-24 NOTE — Telephone Encounter (Signed)
1) Medication(s) Requested (by name): -amLODipine (NORVASC) 10 MG tablet   2) Pharmacy of Choice: -[facility] ([place] [place] - [address]

## 2019-07-24 NOTE — Telephone Encounter (Signed)
Refills sent

## 2019-08-02 ENCOUNTER — Telehealth: Payer: Self-pay | Admitting: Internal Medicine
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
## 2019-08-02 NOTE — Telephone Encounter (Signed)
Will forward to pcp

## 2019-08-03 NOTE — Telephone Encounter (Signed)
Returned pt call and left a detailed vm informing pt of Dr. Wynetta Emery response and if he can give [place] a call to let us know

## 2019-08-08 ENCOUNTER — Other Ambulatory Visit: Payer: Self-pay | Admitting: Chiropractic Medicine

## 2019-08-08 DIAGNOSIS — Z77018 Contact with and (suspected) exposure to other hazardous metals: Secondary | ICD-10-CM

## 2019-08-08 DIAGNOSIS — M25512 Pain in left shoulder: Secondary | ICD-10-CM

## 2019-08-10 ENCOUNTER — Other Ambulatory Visit: Payer: Self-pay

## 2019-08-10 ENCOUNTER — Ambulatory Visit: Payer: Managed Care, Other (non HMO) | Attending: Internal Medicine | Admitting: Family

## 2019-08-10 ENCOUNTER — Encounter: Payer: Self-pay | Admitting: Internal Medicine

## 2019-08-10 VITALS — BP 144/103 | HR 100 | Resp 16 | Wt 186.4 lb

## 2019-08-10 DIAGNOSIS — I10 Essential (primary) hypertension: Secondary | ICD-10-CM

## 2019-08-10 MED ORDER — HYDROCHLOROTHIAZIDE 12.5 MG PO TABS: 12.5000 mg | ORAL_TABLET | Freq: Every day | ORAL | 0 refills | Status: DC

## 2019-08-10 MED ORDER — AMLODIPINE BESYLATE 10 MG PO TABS: 10.0000 mg | ORAL_TABLET | Freq: Every day | ORAL | 3 refills | Status: DC

## 2019-08-10 NOTE — Progress Notes (Signed)
Patient ID: Shilo Pauwels, male    DOB: 09/06/1965  MRN: [id number]  CC: Hypertension medication refill.  Subjective: Dozier Berkovich is a 54 y.o. male who presents for high blood pressure management. His concerns today include: blood pressure medicine refill. Currently taking Amlodipine 10 mg daily. Denies headache. Denies palpitations. Denies vision change. Denies leg swelling. Denies missed doses of blood pressure medication. Denies home monitoring. Restricts salt in the diet. Shortness of breath related to smoking.   Patient Active Problem List   Diagnosis Date Noted  . Liver fibrosis 10/21/2017  . Hyperlipidemia 03/10/2017  . HTN (hypertension) 03/12/2014  . Tobacco abuse 09/25/2011  . COPD (chronic obstructive pulmonary disease) ([place]) 09/08/2011  . Hepatitis C virus infection cured after antiviral drug therapy 04/22/2011     Current Outpatient Medications on File Prior to Visit  Medication Sig Dispense Refill  . albuterol (PROVENTIL HFA;VENTOLIN HFA) 108 (90 Base) MCG/ACT inhaler Inhale 2 puffs into the lungs every 6 (six) hours as needed for wheezing or shortness of breath. 54 Inhaler 5  . atorvastatin (LIPITOR) 10 MG tablet Take 1 tablet (10 mg total) by mouth daily. (Patient taking differently: Take 10 mg by mouth every morning. ) 30 tablet 11  . Fluticasone-Salmeterol (ADVAIR) 100-50 MCG/DOSE AEPB Inhale 1 puff into the lungs 2 (two) times daily. (Patient taking differently: Inhale 1 puff into the lungs 2 (two) times daily as needed. ) 180 each 5  . methocarbamol (ROBAXIN-750) 750 MG tablet Take 1 tablet (750 mg total) by mouth 4 (four) times daily. (Patient not taking: Reported on 08/10/2019) 30 tablet 0  . Multiple Vitamin (MULTIVITAMIN) tablet Take 1 tablet by mouth daily.    . nicotine polacrilex (SM NICOTINE) 2 MG lozenge Take 1 lozenge (2 mg total) by mouth as needed for smoking cessation. 100 tablet 0   No current facility-administered medications on file prior  to visit.    No Known Allergies  Social History   Socioeconomic History  . Marital status: Single    Spouse name: Not on file  . Number of children: Not on file  . Years of education: Not on file  . Highest education level: Not on file  Occupational History  . Not on file  Tobacco Use  . Smoking status: Current Every Day Smoker    Packs/day: 1.00    Years: 30.00    Pack years: 30.00    Types: Cigarettes  . Smokeless tobacco: Never Used  Substance and Sexual Activity  . Alcohol use: No    Alcohol/week: 0.0 standard drinks    Comment: recovering alcoholic - [id number]  . Drug use: Not Currently    Comment: 11-30-2007  quit  . Sexual activity: Yes  Other Topics Concern  . Not on file  Social History Narrative   Patient has unprotected sex with multiple partners within a months time   Social Determinants of Health   Financial Resource Strain:   . Difficulty of Paying Living Expenses: Not on file  Food Insecurity:   . Worried About Charity fundraiser in the Last Year: Not on file  . Ran Out of Food in the Last Year: Not on file  Transportation Needs:   . Lack of Transportation (Medical): Not on file  . Lack of Transportation (Non-Medical): Not on file  Physical Activity:   . Days of Exercise per Week: Not on file  . Minutes of Exercise per Session: Not on file  Stress:   . Feeling of Stress :  Not on file  Social Connections:   . Frequency of Communication with Friends and Family: Not on file  . Frequency of Social Gatherings with Friends and Family: Not on file  . Attends Religious Services: Not on file  . Active Member of Clubs or Organizations: Not on file  . Attends Banker Meetings: Not on file  . Marital Status: Not on file  Intimate Partner Violence:   . Fear of Current or Ex-Partner: Not on file  . Emotionally Abused: Not on file  . Physically Abused: Not on file  . Sexually Abused: Not on file    Family History  Problem Relation Age of  Onset  . COPD Mother        smoker  . COPD Father        smoker--Lung transplant    Past Surgical History:  Procedure Laterality Date  . CIRCUMCISION N/A 05/23/2018   Procedure: CIRCUMCISION ADULT;  Surgeon: Ihor Gully, MD;  Location: [facility];  Service: Urology;  Laterality: N/A;  ONLY NEEDS 45 MIN  . LIVER BIOPSY  2015    ROS: Review of Systems Negative except as stated above  PHYSICAL EXAM: BP (!) 144/103   Pulse 100   Resp 16   Wt 186 lb 6.4 oz (84.6 kg)   SpO2 97%   BMI 28.34 kg/m   General appearance - alert, well appearing, and in no distress, oriented to person, place, and time and normal appearing weight Chest - clear to auscultation, no wheezes, rales or rhonchi, symmetric air entry, no tachypnea, retractions or cyanosis Heart - normal rate, regular rhythm, normal S1, S2, no murmurs, rubs, clicks or gallops   CMP Latest Ref Rng & Units 05/23/2018 05/05/2018 03/05/2017  Glucose 70 - 99 mg/dL 696(E) 95 89  BUN 6 - 24 mg/dL - 5(L) 7  Creatinine 9.52 - 1.27 mg/dL - 8.41 3.24  Sodium 401 - 145 mmol/L 142 142 144  Potassium 3.5 - 5.1 mmol/L 3.7 4.0 4.5  Chloride 96 - 106 mmol/L - 103 107(H)  CO2 20 - 29 mmol/L - 24 22  Calcium 8.7 - 10.2 mg/dL - 9.4 9.6  Total Protein 6.0 - 8.5 g/dL - 6.9 6.9  Total Bilirubin 0.0 - 1.2 mg/dL - 0.2 <0.2  Alkaline Phos 39 - 117 IU/L - 66 50  AST 0 - 40 IU/L - 12 13  ALT 0 - 44 IU/L - 8 10   Lipid Panel     Component Value Date/Time   CHOL 116 05/05/2018 1459   TRIG 73 05/05/2018 1459   HDL 43 05/05/2018 1459   CHOLHDL 2.7 05/05/2018 1459   CHOLHDL 4 01/11/2014 1617   VLDL 16.8 01/11/2014 1617   LDLCALC 58 05/05/2018 1459    CBC    Component Value Date/Time   WBC 10.6 05/05/2018 1459   WBC 7.2 10/31/2014 1049   RBC 5.10 05/05/2018 1459   RBC 5.27 10/31/2014 1049   HGB 16.3 05/23/2018 0919   HGB 13.7 05/05/2018 1459   HCT 48.0 05/23/2018 0919   HCT 41.4 05/05/2018 1459   PLT 299 05/05/2018 1459     MCV 81 05/05/2018 1459   MCH 26.9 05/05/2018 1459   MCH 28.3 10/31/2014 1049   MCHC 33.1 05/05/2018 1459   MCHC 34.1 10/31/2014 1049   RDW 13.2 05/05/2018 1459   LYMPHSABS 2.7 10/31/2014 1049   MONOABS 0.4 10/31/2014 1049   EOSABS 0.5 10/31/2014 1049   BASOSABS 0.1 10/31/2014 1049  ASSESSMENT AND PLAN:  1. Essential Hypertension: - hydrochlorothiazide (HYDRODIURIL) 12.5 MG tablet; Take 1 tablet (12.5 mg total) by mouth daily.  Dispense: 30 tablet; Refill: 0 - amLODipine (NORVASC) 10 MG tablet; Take 1 tablet (10 mg total) by mouth daily.  Dispense: 90 tablet; Refill: 3 -Continue Amlodipine -Begin Hydrochlorothiazide -Return in 2 weeks for blood pressure check with clinical pharmacist -Blood pressure goal < or equal to 130/80.   Patient was given the opportunity to ask questions.  Patient verbalized understanding of the plan and was able to repeat key elements of the plan.    Requested Prescriptions   Signed Prescriptions Disp Refills  . hydrochlorothiazide (HYDRODIURIL) 12.5 MG tablet 30 tablet 0    Sig: Take 1 tablet (12.5 mg total) by mouth daily.  Marland Kitchen amLODipine (NORVASC) 10 MG tablet 90 tablet 3    Sig: Take 1 tablet (10 mg total) by mouth daily.    Rema Fendt, NP

## 2019-08-10 NOTE — Patient Instructions (Signed)
Continue Amlodipine. Begin Hydrochlorothiazide 12.5 mg daily. Return for blood pressure check with clinical pharmacist in 2 weeks. Blood pressure goal 130/80 or less.  Hypertension, Adult Hypertension is another name for high blood pressure. High blood pressure forces your heart to work harder to pump blood. This can cause problems over time. There are two numbers in a blood pressure reading. There is a top number (systolic) over a bottom number (diastolic). It is best to have a blood pressure that is below 120/80. Healthy choices can help lower your blood pressure, or you may need medicine to help lower it. What are the causes? The cause of this condition is not known. Some conditions may be related to high blood pressure. What increases the risk?  Smoking.  Having type 2 diabetes mellitus, high cholesterol, or both.  Not getting enough exercise or physical activity.  Being overweight.  Having too much fat, sugar, calories, or salt (sodium) in your diet.  Drinking too much alcohol.  Having long-term (chronic) kidney disease.  Having a family history of high blood pressure.  Age. Risk increases with age.  Race. You may be at higher risk if you are African American.  Gender. Men are at higher risk than women before age 37. After age 16, women are at higher risk than men.  Having obstructive sleep apnea.  Stress. What are the signs or symptoms?  High blood pressure may not cause symptoms. Very high blood pressure (hypertensive crisis) may cause: ? Headache. ? Feelings of worry or nervousness (anxiety). ? Shortness of breath. ? Nosebleed. ? A feeling of being sick to your stomach (nausea). ? Throwing up (vomiting). ? Changes in how you see. ? Very bad chest pain. ? Seizures. How is this treated?  This condition is treated by making healthy lifestyle changes, such as: ? Eating healthy foods. ? Exercising more. ? Drinking less alcohol.  Your health care provider may  prescribe medicine if lifestyle changes are not enough to get your blood pressure under control, and if: ? Your top number is above 130. ? Your bottom number is above 80.  Your personal target blood pressure may vary. Follow these instructions at home: Eating and drinking   If told, follow the DASH eating plan. To follow this plan: ? Fill one half of your plate at each meal with fruits and vegetables. ? Fill one fourth of your plate at each meal with whole grains. Whole grains include whole-wheat pasta, brown rice, and whole-grain bread. ? Eat or drink low-fat dairy products, such as skim milk or low-fat yogurt. ? Fill one fourth of your plate at each meal with low-fat (lean) proteins. Low-fat proteins include fish, chicken without skin, eggs, beans, and tofu. ? Avoid fatty meat, cured and processed meat, or chicken with skin. ? Avoid pre-made or processed food.  Eat less than 1,500 mg of salt each day.  Do not drink alcohol if: ? Your doctor tells you not to drink. ? You are pregnant, may be pregnant, or are planning to become pregnant.  If you drink alcohol: ? Limit how much you use to:  0-1 drink a day for women.  0-2 drinks a day for men. ? Be aware of how much alcohol is in your drink. In the [place] one drink equals one 12 oz bottle of beer (355 mL), one 5 oz glass of wine (148 mL), or one 1 oz glass of hard liquor (44 mL). Lifestyle   Work with your doctor to stay at a  healthy weight or to lose weight. Ask your doctor what the best weight is for you.  Get at least 30 minutes of exercise most days of the week. This may include walking, swimming, or biking.  Get at least 30 minutes of exercise that strengthens your muscles (resistance exercise) at least 3 days a week. This may include lifting weights or doing Pilates.  Do not use any products that contain nicotine or tobacco, such as cigarettes, e-cigarettes, and chewing tobacco. If you need help quitting, ask your  doctor.  Check your blood pressure at home as told by your doctor.  Keep all follow-up visits as told by your doctor. This is important. Medicines  Take over-the-counter and prescription medicines only as told by your doctor. Follow directions carefully.  Do not skip doses of blood pressure medicine. The medicine does not work as well if you skip doses. Skipping doses also puts you at risk for problems.  Ask your doctor about side effects or reactions to medicines that you should watch for. Contact a doctor if you:  Think you are having a reaction to the medicine you are taking.  Have headaches that keep coming back (recurring).  Feel dizzy.  Have swelling in your ankles.  Have trouble with your vision. Get help right away if you:  Get a very bad headache.  Start to feel mixed up (confused).  Feel weak or numb.  Feel faint.  Have very bad pain in your: ? Chest. ? Belly (abdomen).  Throw up more than once.  Have trouble breathing. Summary  Hypertension is another name for high blood pressure.  High blood pressure forces your heart to work harder to pump blood.  For most people, a normal blood pressure is less than 120/80.  Making healthy choices can help lower blood pressure. If your blood pressure does not get lower with healthy choices, you may need to take medicine. This information is not intended to replace advice given to you by your health care provider. Make sure you discuss any questions you have with your health care provider. Document Revised: 03/30/2018 Document Reviewed: 03/30/2018 Elsevier Patient Education  2020 [employer].

## 2019-08-17 ENCOUNTER — Other Ambulatory Visit: Payer: Managed Care, Other (non HMO)

## 2019-08-18 ENCOUNTER — Ambulatory Visit: Payer: Managed Care, Other (non HMO) | Admitting: Internal Medicine

## 2019-08-23 ENCOUNTER — Telehealth: Payer: Self-pay | Admitting: Internal Medicine

## 2019-08-23 NOTE — Telephone Encounter (Signed)
Patient called and stated that he does not like the  hydrochlorothiazide (HYDRODIURIL) 12.5 MG tablet [[id number]] patient stated that he was fine on his amLODipine (NORVASC) 10 MG tablet [[id number]] Patient stated was it okay to go back on his older bp medication , Please fu at your earliest convenience.

## 2019-08-24 ENCOUNTER — Other Ambulatory Visit: Payer: Self-pay

## 2019-08-24 ENCOUNTER — Ambulatory Visit
Admission: RE | Admit: 2019-08-24 | Discharge: 2019-08-24 | Disposition: A | Discharge status: Home / Self Care | Payer: Self-pay | Source: Ambulatory Visit | Attending: Chiropractic Medicine | Admitting: Chiropractic Medicine

## 2019-08-24 DIAGNOSIS — Z77018 Contact with and (suspected) exposure to other hazardous metals: Secondary | ICD-10-CM

## 2019-08-24 DIAGNOSIS — M25512 Pain in left shoulder: Secondary | ICD-10-CM

## 2019-08-25 NOTE — Telephone Encounter (Signed)
Will forward to pcp

## 2019-08-25 NOTE — Telephone Encounter (Signed)
Contacted pt to go over Dr. Laural Benes response. Pt states he understands. Pt is schedule to see Franky Macho on 2/5 at 4pm

## 2019-09-08 ENCOUNTER — Other Ambulatory Visit: Payer: Self-pay

## 2019-09-08 ENCOUNTER — Ambulatory Visit: Payer: Managed Care, Other (non HMO) | Attending: Internal Medicine | Admitting: Pharmacist

## 2019-09-08 VITALS — BP 133/90 | HR 105

## 2019-09-08 DIAGNOSIS — Z0131 Encounter for examination of blood pressure with abnormal findings: Secondary | ICD-10-CM

## 2019-09-08 DIAGNOSIS — I10 Essential (primary) hypertension: Secondary | ICD-10-CM

## 2019-09-08 NOTE — Progress Notes (Signed)
   S:    PCP: Dr. Laural Benes  Patient arrives in good spirits. Presents to the clinic for hypertension evaluation, counseling, and management.  Patient was referred and last seen by Primary Care Provider on 08/23/19.   Patient denies adherence with HCTZ. States that it caused him to have nightmares. He is compliant with amlodipine.   Current BP Medications include:  Amlodipine 10 mg daily, HCTZ (not taking)  Dietary habits include: pt denies eating salt; drinks coffee and soda throughout the day Exercise habits include: mainly through work Family / Social history:  - FHx: COPD - Tobacco: current 1 PPD smoker  - Alcohol: none   O:  Vitals:   09/08/19 1613  BP: 133/90  Pulse: (!) 105    Home BP readings: none  Last 3 Office BP readings: BP Readings from Last 3 Encounters:  08/10/19 (!) 144/103  07/06/19 (!) 154/93  12/06/18 (!) 150/83   BMET    Component Value Date/Time   NA 142 05/23/2018 0919   NA 142 05/05/2018 1459   K 3.7 05/23/2018 0919   CL 103 05/05/2018 1459   CO2 24 05/05/2018 1459   GLUCOSE 101 (H) 05/23/2018 0919   BUN 5 (L) 05/05/2018 1459   CREATININE 0.86 05/05/2018 1459   CREATININE 0.85 10/31/2014 1049   CALCIUM 9.4 05/05/2018 1459   GFRNONAA 100 05/05/2018 1459   GFRNONAA >89 10/31/2014 1049   GFRAA 115 05/05/2018 1459   GFRAA >89 10/31/2014 1049    Renal function: CrCl cannot be calculated (Patient's most recent lab result is older than the maximum 21 days allowed.).  Clinical ASCVD: No  The ASCVD Risk score Denman George DC Jr., et al., 2013) failed to calculate for the following reasons:   The valid total cholesterol range is 130 to 320 mg/dL  A/P: Hypertension longstanding/newly diagnosed currently above goal on current medications. BP Goal = <130/80 mmHg. Patient is adherent with amlodipine but does not wish to add medications despite BP not being at goal.   -Continued amlodipine 10 mg daily.  -Discontinued HCTZ -Counseled on lifestyle  modifications for blood pressure control including reduced dietary sodium, increased exercise, adequate sleep  Results reviewed and written information provided.   Total time in face-to-face counseling 15 minutes.   F/U Clinic Visit with PCP.  Butch Penny, PharmD, CPP Clinical Pharmacist [facility] [phone]

## 2019-09-09 ENCOUNTER — Encounter: Payer: Self-pay | Admitting: Internal Medicine

## 2020-03-20 ENCOUNTER — Ambulatory Visit (HOSPITAL_COMMUNITY)
Admission: EM | Admit: 2020-03-20 | Discharge: 2020-03-20 | Disposition: A | Discharge status: Home / Self Care | Payer: Managed Care, Other (non HMO) | Attending: Urgent Care | Admitting: Urgent Care

## 2020-03-20 ENCOUNTER — Encounter (HOSPITAL_COMMUNITY): Payer: Self-pay | Admitting: Emergency Medicine

## 2020-03-20 ENCOUNTER — Other Ambulatory Visit: Payer: Self-pay

## 2020-03-20 DIAGNOSIS — R05 Cough: Secondary | ICD-10-CM | POA: Insufficient documentation

## 2020-03-20 DIAGNOSIS — F17200 Nicotine dependence, unspecified, uncomplicated: Secondary | ICD-10-CM

## 2020-03-20 DIAGNOSIS — G44209 Tension-type headache, unspecified, not intractable: Secondary | ICD-10-CM | POA: Diagnosis present

## 2020-03-20 DIAGNOSIS — R531 Weakness: Secondary | ICD-10-CM | POA: Diagnosis present

## 2020-03-20 DIAGNOSIS — M545 Low back pain, unspecified: Secondary | ICD-10-CM

## 2020-03-20 DIAGNOSIS — R059 Cough, unspecified: Secondary | ICD-10-CM

## 2020-03-20 DIAGNOSIS — Z20822 Contact with and (suspected) exposure to covid-19: Secondary | ICD-10-CM | POA: Diagnosis not present

## 2020-03-20 MED ORDER — TIZANIDINE HCL 4 MG PO TABS: 4.0000 mg | ORAL_TABLET | Freq: Every day | ORAL | 0 refills | Status: DC

## 2020-03-20 MED ORDER — NAPROXEN 500 MG PO TABS: 500.0000 mg | ORAL_TABLET | Freq: Two times a day (BID) | ORAL | 0 refills | Status: DC
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
## 2020-03-20 NOTE — ED Triage Notes (Signed)
Pt presents with back pain, weakness, headache. Pt states he is a smoker and always has SOB and dry cough.   Denies n,v,d, fever, chest, loss of taste or smell.

## 2020-03-21 LAB — SARS CORONAVIRUS 2 (TAT 6-24 HRS): SARS Coronavirus 2: NEGATIVE

## 2020-08-21 ENCOUNTER — Other Ambulatory Visit: Payer: Self-pay | Admitting: Family

## 2020-08-21 DIAGNOSIS — I10 Essential (primary) hypertension: Secondary | ICD-10-CM

## 2020-08-25 ENCOUNTER — Other Ambulatory Visit: Payer: Self-pay | Admitting: Family

## 2020-08-25 DIAGNOSIS — I10 Essential (primary) hypertension: Secondary | ICD-10-CM

## 2020-08-26 NOTE — Telephone Encounter (Signed)
Pt called and scheduled an appt. He is requesting to have a refill to last him until his appt on 09/18/20. Please advise.

## 2020-09-18 ENCOUNTER — Ambulatory Visit: Payer: 59 | Attending: Physician Assistant | Admitting: Physician Assistant

## 2020-09-18 ENCOUNTER — Encounter: Payer: Self-pay | Admitting: Physician Assistant

## 2020-09-18 ENCOUNTER — Ambulatory Visit (HOSPITAL_COMMUNITY)
Admission: RE | Admit: 2020-09-18 | Discharge: 2020-09-18 | Disposition: A | Discharge status: Home / Self Care | Payer: 59 | Source: Ambulatory Visit | Attending: Physician Assistant | Admitting: Physician Assistant

## 2020-09-18 ENCOUNTER — Other Ambulatory Visit: Payer: Self-pay

## 2020-09-18 VITALS — BP 119/82 | HR 79 | Resp 16 | Wt 171.6 lb

## 2020-09-18 DIAGNOSIS — R6881 Early satiety: Secondary | ICD-10-CM | POA: Insufficient documentation

## 2020-09-18 DIAGNOSIS — Z720 Tobacco use: Secondary | ICD-10-CM | POA: Insufficient documentation

## 2020-09-18 DIAGNOSIS — E785 Hyperlipidemia, unspecified: Secondary | ICD-10-CM

## 2020-09-18 DIAGNOSIS — J449 Chronic obstructive pulmonary disease, unspecified: Secondary | ICD-10-CM | POA: Diagnosis present

## 2020-09-18 DIAGNOSIS — R634 Abnormal weight loss: Secondary | ICD-10-CM

## 2020-09-18 DIAGNOSIS — I10 Essential (primary) hypertension: Secondary | ICD-10-CM

## 2020-09-18 MED ORDER — ATORVASTATIN CALCIUM 10 MG PO TABS: 10.0000 mg | ORAL_TABLET | Freq: Every day | ORAL | 11 refills | Status: DC

## 2020-09-18 MED ORDER — ALBUTEROL SULFATE HFA 108 (90 BASE) MCG/ACT IN AERS: 2.0000 | INHALATION_SPRAY | Freq: Four times a day (QID) | RESPIRATORY_TRACT | 5 refills | Status: DC | PRN

## 2020-09-18 MED ORDER — FLUTICASONE-SALMETEROL 100-50 MCG/DOSE IN AEPB
1.0000 | INHALATION_SPRAY | Freq: Two times a day (BID) | RESPIRATORY_TRACT | 5 refills | Status: DC
Start: 2020-09-18 — End: 2020-12-19

## 2020-09-18 MED ORDER — AMLODIPINE BESYLATE 10 MG PO TABS: 10.0000 mg | ORAL_TABLET | Freq: Every day | ORAL | 0 refills | Status: DC
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
## 2020-09-19 ENCOUNTER — Other Ambulatory Visit: Payer: Self-pay | Admitting: Physician Assistant

## 2020-09-19 DIAGNOSIS — E559 Vitamin D deficiency, unspecified: Secondary | ICD-10-CM

## 2020-09-19 LAB — COMPREHENSIVE METABOLIC PANEL
ALT: 12 IU/L (ref 0–44)
AST: 10 IU/L (ref 0–40)
Albumin/Globulin Ratio: 1.6 (ref 1.2–2.2)
Albumin: 4.2 g/dL (ref 3.8–4.9)
Alkaline Phosphatase: 64 IU/L (ref 44–121)
BUN/Creatinine Ratio: 4 — ABNORMAL LOW (ref 9–20)
BUN: 4 mg/dL — ABNORMAL LOW (ref 6–24)
Bilirubin Total: 0.3 mg/dL (ref 0.0–1.2)
CO2: 21 mmol/L (ref 20–29)
Calcium: 9.6 mg/dL (ref 8.7–10.2)
Chloride: 103 mmol/L (ref 96–106)
Creatinine, Ser: 0.98 mg/dL (ref 0.76–1.27)
GFR calc Af Amer: 101 mL/min/{1.73_m2} (ref >59)
GFR calc non Af Amer: 87 mL/min/{1.73_m2} (ref >59)
Globulin, Total: 2.6 g/dL (ref 1.5–4.5)
Glucose: 78 mg/dL (ref 65–99)
Potassium: 3.8 mmol/L (ref 3.5–5.2)
Sodium: 142 mmol/L (ref 134–144)
Total Protein: 6.8 g/dL (ref 6.0–8.5)

## 2020-09-19 LAB — LIPASE: Lipase: 24 U/L (ref 13–78)

## 2020-09-19 LAB — THYROID PANEL WITH TSH
Free Thyroxine Index: 1.7 (ref 1.2–4.9)
T3 Uptake Ratio: 27 % (ref 24–39)
T4, Total: 6.3 ug/dL (ref 4.5–12.0)
TSH: 0.963 u[IU]/mL (ref 0.450–4.500)

## 2020-09-19 LAB — VITAMIN D 25 HYDROXY (VIT D DEFICIENCY, FRACTURES): Vit D, 25-Hydroxy: 18.8 ng/mL — ABNORMAL LOW (ref 30.0–100.0)

## 2020-09-19 MED ORDER — VITAMIN D (ERGOCALCIFEROL) 1.25 MG (50000 UNIT) PO CAPS: 50000.0000 [IU] | ORAL_CAPSULE | ORAL | 0 refills | Status: DC

## 2020-10-30 ENCOUNTER — Other Ambulatory Visit: Payer: Self-pay | Admitting: Internal Medicine

## 2020-10-30 ENCOUNTER — Other Ambulatory Visit: Payer: Self-pay | Admitting: Physician Assistant

## 2020-10-30 DIAGNOSIS — I10 Essential (primary) hypertension: Secondary | ICD-10-CM

## 2020-10-30 NOTE — Telephone Encounter (Signed)
Medication Refill - Medication:   amLODipine (NORVASC) 10 MG tablet    Has the patient contacted their pharmacy? Yes.   no refills, pt stated he is about to run out.    Preferred Pharmacy (with phone number or street name):   [facility] [address] [place], [place] - [address]  [address] [place] [address]  Phone: [phone] Fax: [phone]     Agent: Please be advised that RX refills may take up to 3 business days. We ask that you follow-up with your pharmacy.

## 2020-12-19 ENCOUNTER — Other Ambulatory Visit: Payer: Self-pay

## 2020-12-19 ENCOUNTER — Other Ambulatory Visit (HOSPITAL_COMMUNITY)
Admission: RE | Admit: 2020-12-19 | Discharge: 2020-12-19 | Disposition: A | Discharge status: Home / Self Care | Payer: 59 | Source: Ambulatory Visit | Attending: Internal Medicine | Admitting: Internal Medicine

## 2020-12-19 ENCOUNTER — Ambulatory Visit: Payer: 59 | Attending: Internal Medicine | Admitting: Internal Medicine

## 2020-12-19 ENCOUNTER — Encounter: Payer: Self-pay | Admitting: Internal Medicine

## 2020-12-19 VITALS — BP 125/82 | HR 88 | Resp 16 | Wt 173.4 lb

## 2020-12-19 DIAGNOSIS — E785 Hyperlipidemia, unspecified: Secondary | ICD-10-CM | POA: Diagnosis not present

## 2020-12-19 DIAGNOSIS — F17210 Nicotine dependence, cigarettes, uncomplicated: Secondary | ICD-10-CM

## 2020-12-19 DIAGNOSIS — I10 Essential (primary) hypertension: Secondary | ICD-10-CM

## 2020-12-19 DIAGNOSIS — Z720 Tobacco use: Secondary | ICD-10-CM

## 2020-12-19 DIAGNOSIS — J449 Chronic obstructive pulmonary disease, unspecified: Secondary | ICD-10-CM | POA: Diagnosis not present

## 2020-12-19 DIAGNOSIS — Z114 Encounter for screening for human immunodeficiency virus [HIV]: Secondary | ICD-10-CM

## 2020-12-19 DIAGNOSIS — Z113 Encounter for screening for infections with a predominantly sexual mode of transmission: Secondary | ICD-10-CM

## 2020-12-19 DIAGNOSIS — E559 Vitamin D deficiency, unspecified: Secondary | ICD-10-CM

## 2020-12-19 DIAGNOSIS — Z122 Encounter for screening for malignant neoplasm of respiratory organs: Secondary | ICD-10-CM

## 2020-12-19 DIAGNOSIS — Z125 Encounter for screening for malignant neoplasm of prostate: Secondary | ICD-10-CM

## 2020-12-19 DIAGNOSIS — Z2821 Immunization not carried out because of patient refusal: Secondary | ICD-10-CM | POA: Insufficient documentation

## 2020-12-19 MED ORDER — FLUTICASONE-SALMETEROL 100-50 MCG/ACT IN AEPB: 1.0000 | INHALATION_SPRAY | Freq: Two times a day (BID) | RESPIRATORY_TRACT | 3 refills | Status: DC

## 2020-12-19 MED ORDER — ALBUTEROL SULFATE HFA 108 (90 BASE) MCG/ACT IN AERS: 2.0000 | INHALATION_SPRAY | Freq: Four times a day (QID) | RESPIRATORY_TRACT | 2 refills | Status: DC | PRN

## 2020-12-19 MED ORDER — ATORVASTATIN CALCIUM 10 MG PO TABS: 10.0000 mg | ORAL_TABLET | Freq: Every day | ORAL | 2 refills | Status: DC

## 2020-12-19 MED ORDER — AMLODIPINE BESYLATE 10 MG PO TABS: 1.0000 | ORAL_TABLET | Freq: Every day | ORAL | 2 refills | Status: DC

## 2020-12-19 NOTE — Progress Notes (Signed)
Patient ID: Connor Oneal, male    DOB: [birth year]  MRN: [id number]  CC: Hypertension   Subjective: Connor Oneal is a 55 y.o. male who presents for chronic ds management.  I last saw him in May 2020.  He saw the physician assistant in February of this year. His concerns today include:  history of hepatitis C that has been cured withHarvonias of 2016, COPD, tob depen, HL and HTN.  HTN:  Taking and tolerating amlodipine.  He tries to limit salt in the foods.  Denies any chest pains, shortness of breath, swelling in the legs or chronic headaches or dizziness.  HL: Reports compliance with atorvastatin.  No muscle cramps.  Vit D: Found to have vitamin D deficiency on blood test done in February.  He is taking once weekly high-dose vitamin D.  He has 2 pills left.    COPD: Chronic nonproductive cough which he attributes to smoking.  Still smoking a little over 1 pk/day.  Smoke for 38 yrs. not ready to give a trial of quitting. Has Advair and albuterol inhaler.  He has not had to use the Advair in quite some time and the same thing for the albuterol.  He uses both of them as needed.     On last visit he was concerned with weight loss.  Weight has remained stable since then.  He denies any changes in bowel habits or blood in the stools.  He reports having had a few colonoscopies in the past with no polyps removed.  No problems swallowing.  No hematuria.  No fever or night sweats. Request STD tests.  Denies any dysuria or penile discharge.  He is with the same partner for some time.  She gets tested for STDs regularly and he wanted to be tested including for HIV.   HM:  Declines COVID-19 vaccine.  Due for prostate cancer screening.  He is agreeable to PSA. Patient Active Problem List   Diagnosis Date Noted  . Liver fibrosis 10/21/2017  . Hyperlipidemia 03/10/2017  . HTN (hypertension) 03/12/2014  . Tobacco abuse 09/25/2011  . COPD (chronic obstructive pulmonary disease) (HCC)  09/08/2011  . Hepatitis C virus infection cured after antiviral drug therapy 04/22/2011     Current Outpatient Medications on File Prior to Visit  Medication Sig Dispense Refill  . albuterol (VENTOLIN HFA) 108 (90 Base) MCG/ACT inhaler Inhale 2 puffs into the lungs every 6 (six) hours as needed for wheezing or shortness of breath. 54 each 5  . amLODipine (NORVASC) 10 MG tablet Take 1 tablet by mouth once daily 30 tablet 2  . atorvastatin (LIPITOR) 10 MG tablet Take 1 tablet (10 mg total) by mouth daily. 30 tablet 11  . Fluticasone-Salmeterol (ADVAIR) 100-50 MCG/DOSE AEPB Inhale 1 puff into the lungs 2 (two) times daily. 180 each 5  . methocarbamol (ROBAXIN-750) 750 MG tablet Take 1 tablet (750 mg total) by mouth 4 (four) times daily. (Patient not taking: Reported on 08/10/2019) 30 tablet 0  . Multiple Vitamin (MULTIVITAMIN) tablet Take 1 tablet by mouth daily.    . naproxen (NAPROSYN) 500 MG tablet Take 1 tablet (500 mg total) by mouth 2 (two) times daily with a meal. 30 tablet 0  . nicotine polacrilex (SM NICOTINE) 2 MG lozenge Take 1 lozenge (2 mg total) by mouth as needed for smoking cessation. 100 tablet 0  . tiZANidine (ZANAFLEX) 4 MG tablet Take 1 tablet (4 mg total) by mouth at bedtime. 30 tablet 0  . Vitamin  D, Ergocalciferol, (DRISDOL) 1.25 MG (50000 UNIT) CAPS capsule Take 1 capsule (50,000 Units total) by mouth every 7 (seven) days. 16 capsule 0   No current facility-administered medications on file prior to visit.    Allergies  Allergen Reactions  . Hctz [Hydrochlorothiazide]     Causes nightmares    Social History   Socioeconomic History  . Marital status: Single    Spouse name: Not on file  . Number of children: Not on file  . Years of education: Not on file  . Highest education level: Not on file  Occupational History  . Not on file  Tobacco Use  . Smoking status: Current Every Day Smoker    Packs/day: 1.00    Years: 30.00    Pack years: 30.00    Types:  Cigarettes  . Smokeless tobacco: Never Used  Vaping Use  . Vaping Use: Never used  Substance and Sexual Activity  . Alcohol use: No    Alcohol/week: 0.0 standard drinks    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  . Drug use: Not Currently    Comment: 11-30-2007  quit  . Sexual activity: Yes  Other Topics Concern  . Not on file  Social History Narrative   Patient has unprotected sex with multiple partners within a months time   Social Determinants of Health   Financial Resource Strain: Not on file  Food Insecurity: Not on file  Transportation Needs: Not on file  Physical Activity: Not on file  Stress: Not on file  Social Connections: Not on file  Intimate Partner Violence: Not on file    Family History  Problem Relation Age of Onset  . COPD Mother        smoker  . COPD Father        smoker--Lung transplant    Past Surgical History:  Procedure Laterality Date  . CIRCUMCISION N/A 05/23/2018   Procedure: CIRCUMCISION ADULT;  Surgeon: Ihor Gullyttelin, Mark, MD;  [facility] [facility];  Service: Urology;  Laterality: N/A;  ONLY NEEDS 45 MIN  . LIVER BIOPSY  2015    ROS: Review of Systems Negative except as stated above  PHYSICAL EXAM: BP 125/82   Pulse 88   Resp 16   Wt 173 lb 6.4 oz (78.7 kg)   SpO2 95%   BMI 26.37 kg/m   Wt Readings from Last 3 Encounters:  12/19/20 173 lb 6.4 oz (78.7 kg)  09/18/20 171 lb 9.6 oz (77.8 kg)  08/10/19 186 lb 6.4 oz (84.6 kg)    Physical Exam General appearance - alert, well appearing, middle-age to older African-American male and in no distress Mental status - normal mood, behavior, speech, dress, motor activity, and thought processes Mouth - mucous membranes moist, pharynx normal without lesions Neck - supple, no significant adenopathy Axilla: No axillary lymphadenopathy Chest - clear to auscultation, no wheezes, rales or rhonchi, symmetric air entry Heart - normal rate, regular rhythm, normal S1, S2, no  murmurs, rubs, clicks or gallops Abdomen - soft, nontender, nondistended, no masses or organomegaly Extremities - peripheral pulses normal, no pedal edema, no clubbing or cyanosis  CMP Latest Ref Rng & Units 09/18/2020 05/23/2018 05/05/2018  Glucose 65 - 99 mg/dL 78 161(W101(H) 95  BUN 6 - 24 mg/dL 4(L) - 5(L)  Creatinine 0.76 - 1.27 mg/dL 9.600.98 - 4.540.86  Sodium 098134 - 144 mmol/L 142 142 142  Potassium 3.5 - 5.2 mmol/L 3.8 3.7 4.0  Chloride 96 - 106 mmol/L 103 - 103  CO2  20 - 29 mmol/L 21 - 24  Calcium 8.7 - 10.2 mg/dL 9.6 - 9.4  Total Protein 6.0 - 8.5 g/dL 6.8 - 6.9  Total Bilirubin 0.0 - 1.2 mg/dL 0.3 - 0.2  Alkaline Phos 44 - 121 IU/L 64 - 66  AST 0 - 40 IU/L 10 - 12  ALT 0 - 44 IU/L 12 - 8   Lipid Panel     Component Value Date/Time   CHOL 116 05/05/2018 1459   TRIG 73 05/05/2018 1459   HDL 43 05/05/2018 1459   CHOLHDL 2.7 05/05/2018 1459   CHOLHDL 4 01/11/2014 1617   VLDL 16.8 01/11/2014 1617   LDLCALC 58 05/05/2018 1459    CBC    Component Value Date/Time   WBC 10.6 05/05/2018 1459   WBC 7.2 10/31/2014 1049   RBC 5.10 05/05/2018 1459   RBC 5.27 10/31/2014 1049   HGB 16.3 05/23/2018 0919   HGB 13.7 05/05/2018 1459   HCT 48.0 05/23/2018 0919   HCT 41.4 05/05/2018 1459   PLT 299 05/05/2018 1459   MCV 81 05/05/2018 1459   MCH 26.9 05/05/2018 1459   MCH 28.3 10/31/2014 1049   MCHC 33.1 05/05/2018 1459   MCHC 34.1 10/31/2014 1049   RDW 13.2 05/05/2018 1459   LYMPHSABS 2.7 10/31/2014 1049   MONOABS 0.4 10/31/2014 1049   EOSABS 0.5 10/31/2014 1049   BASOSABS 0.1 10/31/2014 1049    ASSESSMENT AND PLAN: 1. Essential hypertension Close to goal.  Continue amlodipine - amLODipine (NORVASC) 10 MG tablet; Take 1 tablet (10 mg total) by mouth daily.  Dispense: 90 tablet; Refill: 2  2. Hyperlipidemia, unspecified hyperlipidemia type - Lipid panel - atorvastatin (LIPITOR) 10 MG tablet; Take 1 tablet (10 mg total) by mouth daily.  Dispense: 90 tablet; Refill: 2  3. Tobacco  abuse Advised to quit.  Discussed health risks associated with smoking.  Patient not ready to give a trial of quitting.  4. Chronic obstructive pulmonary disease, unspecified COPD type (HCC) Patient is doing okay.  I told him that since he has not had to use Advair in quite some time we can stop the Advair but he can keep the albuterol to use as needed.  Patient states he prefers to have both to use whenever he has a flareup.  Strongly advised to quit smoking - albuterol (VENTOLIN HFA) 108 (90 Base) MCG/ACT inhaler; Inhale 2 puffs into the lungs every 6 (six) hours as needed for wheezing or shortness of breath.  Dispense: 8 g; Refill: 2 - fluticasone-salmeterol (ADVAIR) 100-50 MCG/ACT AEPB; Inhale 1 puff into the lungs 2 (two) times daily.  Dispense: 1 each; Refill: 3  5. Vitamin D deficiency We will recheck levels today - VITAMIN D 25 Hydroxy (Vit-D Deficiency, Fractures)  6. Prostate cancer screening Discussed prostate cancer screening and limitation of the test.  Patient agrees to be screened. - PSA  7. Screening for HIV (human immunodeficiency virus) - HIV antibody (with reflex)  8. Screen for STD (sexually transmitted disease) - Urine cytology ancillary only  9. Screening for lung cancer Discussed lung cancer screening for which he qualifies having greater than 30 pack years of smoking and over the age of 20.  Still currently smoking.  Patient agreeable to be screened - CT CHEST LUNG CA SCREEN LOW DOSE W/O CM; Future  10. COVID-19 vaccination declined Discussed and encouraged him to get the COVID-19 vaccine but patient declined.    Patient was given the opportunity to ask questions.  Patient verbalized  understanding of the plan and was able to repeat key elements of the plan.   No orders of the defined types were placed in this encounter.    Requested Prescriptions    No prescriptions requested or ordered in this encounter    No follow-ups on file.  Jonah Blue,  MD, FACP

## 2020-12-20 LAB — LIPID PANEL
Chol/HDL Ratio: 2.6 ratio (ref 0.0–5.0)
Cholesterol, Total: 136 mg/dL (ref 100–199)
HDL: 52 mg/dL (ref >39)
LDL Chol Calc (NIH): 71 mg/dL (ref 0–99)
Triglycerides: 64 mg/dL (ref 0–149)
VLDL Cholesterol Cal: 13 mg/dL (ref 5–40)

## 2020-12-20 LAB — VITAMIN D 25 HYDROXY (VIT D DEFICIENCY, FRACTURES): Vit D, 25-Hydroxy: 49.1 ng/mL (ref 30.0–100.0)

## 2020-12-20 LAB — HIV ANTIBODY (ROUTINE TESTING W REFLEX): HIV Screen 4th Generation wRfx: NONREACTIVE

## 2020-12-20 LAB — PSA: Prostate Specific Ag, Serum: 0.7 ng/mL (ref 0.0–4.0)

## 2020-12-23 LAB — URINE CYTOLOGY ANCILLARY ONLY
Chlamydia: NEGATIVE
Comment: NEGATIVE
Comment: NEGATIVE
Comment: NORMAL
Neisseria Gonorrhea: NEGATIVE
Trichomonas: NEGATIVE

## 2020-12-26 ENCOUNTER — Ambulatory Visit (HOSPITAL_COMMUNITY): Payer: 59

## 2021-01-02 ENCOUNTER — Ambulatory Visit (HOSPITAL_COMMUNITY)
Admission: RE | Admit: 2021-01-02 | Discharge: 2021-01-02 | Disposition: A | Discharge status: Home / Self Care | Payer: 59 | Source: Ambulatory Visit | Attending: Internal Medicine | Admitting: Internal Medicine

## 2021-01-02 ENCOUNTER — Other Ambulatory Visit: Payer: Self-pay

## 2021-01-02 DIAGNOSIS — Z122 Encounter for screening for malignant neoplasm of respiratory organs: Secondary | ICD-10-CM | POA: Insufficient documentation

## 2021-01-14 ENCOUNTER — Other Ambulatory Visit: Payer: Self-pay | Admitting: Physician Assistant

## 2021-01-14 DIAGNOSIS — E559 Vitamin D deficiency, unspecified: Secondary | ICD-10-CM

## 2021-01-14 NOTE — Telephone Encounter (Signed)
Requested medications are due for refill today.  yes  Requested medications are on the active medications list.  yes  Last refill. 09/19/2020  Future visit scheduled.   no  Notes to clinic.  Medication not delegated.

## 2021-04-04 ENCOUNTER — Encounter (HOSPITAL_COMMUNITY): Payer: Self-pay

## 2021-04-04 ENCOUNTER — Other Ambulatory Visit: Payer: Self-pay

## 2021-04-04 ENCOUNTER — Ambulatory Visit (HOSPITAL_COMMUNITY)
Admission: EM | Admit: 2021-04-04 | Discharge: 2021-04-04 | Disposition: A | Discharge status: Home / Self Care | Payer: 59 | Attending: Internal Medicine | Admitting: Internal Medicine

## 2021-04-04 DIAGNOSIS — J111 Influenza due to unidentified influenza virus with other respiratory manifestations: Secondary | ICD-10-CM | POA: Insufficient documentation

## 2021-04-04 DIAGNOSIS — U071 COVID-19: Secondary | ICD-10-CM | POA: Diagnosis not present

## 2021-04-04 LAB — POC INFLUENZA A AND B ANTIGEN (URGENT CARE ONLY)
INFLUENZA A ANTIGEN, POC: NEGATIVE
INFLUENZA B ANTIGEN, POC: NEGATIVE

## 2021-04-04 MED ORDER — IBUPROFEN 600 MG PO TABS: 600.0000 mg | ORAL_TABLET | Freq: Four times a day (QID) | ORAL | 0 refills | Status: DC | PRN

## 2021-04-04 MED ORDER — ACETAMINOPHEN 325 MG PO TABS
ORAL_TABLET | ORAL | Status: AC
  Filled 2021-04-04: qty 3

## 2021-04-04 MED ORDER — ACETAMINOPHEN 325 MG PO TABS
975.0000 mg | ORAL_TABLET | Freq: Once | ORAL | Status: AC
  Administered 2021-04-04: 975 mg via ORAL

## 2021-04-04 NOTE — Discharge Instructions (Addendum)
Continue adequate nutrition Quarantine until COVID-19 results are available Alternate Tylenol with ibuprofen for fever and/or body aches. We will call you with recommendations if labs are abnormal. Return to urgent care if symptoms worsen.

## 2021-04-04 NOTE — ED Triage Notes (Signed)
Pt presents with generalized body aches and fever since waking up this morning.

## 2021-04-05 ENCOUNTER — Telehealth (HOSPITAL_COMMUNITY): Payer: Self-pay | Admitting: Internal Medicine

## 2021-04-05 LAB — SARS CORONAVIRUS 2 (TAT 6-24 HRS): SARS Coronavirus 2: POSITIVE — AB
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
## 2021-06-05 ENCOUNTER — Emergency Department
Admission: EM | Admit: 2021-06-05 | Discharge: 2021-06-05 | Disposition: A | Discharge status: Home / Self Care | Payer: 59 | Source: Home / Self Care

## 2021-06-05 ENCOUNTER — Encounter: Payer: Self-pay | Admitting: Emergency Medicine

## 2021-06-05 ENCOUNTER — Other Ambulatory Visit: Payer: Self-pay

## 2021-06-05 MED ORDER — PREDNISONE 20 MG PO TABS: 20.0000 mg | ORAL_TABLET | Freq: Two times a day (BID) | ORAL | 0 refills | Status: DC

## 2021-06-05 MED ORDER — BENZONATATE 200 MG PO CAPS: 200.0000 mg | ORAL_CAPSULE | Freq: Three times a day (TID) | ORAL | 0 refills | Status: DC | PRN

## 2021-06-05 MED ORDER — AMOXICILLIN 875 MG PO TABS: 875.0000 mg | ORAL_TABLET | Freq: Two times a day (BID) | ORAL | 0 refills | Status: DC

## 2021-06-05 NOTE — Discharge Instructions (Signed)
Drink lots of fluids Try to cut down on your smoking Take the antibiotic 2 times a day Take the prednisone 2 times a day for 5 days I have prescribed Tessalon for the cough.  You may take this 3 times a day See your usual doctor in follow-up You are off work until Monday

## 2021-06-05 NOTE — ED Triage Notes (Signed)
Pt states Monday night he developed night sweats and sneezing. Has not checked temp. Denies HA, body aches or GI symptoms.

## 2021-10-03 ENCOUNTER — Emergency Department (INDEPENDENT_AMBULATORY_CARE_PROVIDER_SITE_OTHER)
Admission: EM | Admit: 2021-10-03 | Discharge: 2021-10-03 | Disposition: A | Discharge status: Home / Self Care | Payer: 59 | Source: Home / Self Care | Attending: Family Medicine | Admitting: Family Medicine

## 2021-10-03 ENCOUNTER — Encounter: Payer: Self-pay | Admitting: Emergency Medicine

## 2021-10-03 ENCOUNTER — Other Ambulatory Visit: Payer: Self-pay

## 2021-10-03 DIAGNOSIS — J209 Acute bronchitis, unspecified: Secondary | ICD-10-CM | POA: Diagnosis not present

## 2021-10-03 MED ORDER — PREDNISONE 20 MG PO TABS: ORAL_TABLET | ORAL | 0 refills | Status: DC

## 2021-10-03 MED ORDER — AZITHROMYCIN 250 MG PO TABS: ORAL_TABLET | ORAL | 0 refills | Status: DC
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
## 2021-10-03 NOTE — Discharge Instructions (Signed)
Take plain guaifenesin (1200mg  extended release tabs such as Mucinex) twice daily, with plenty of water, for cough and congestion.  Get adequate rest.   ?May use Afrin nasal spray (or generic oxymetazoline) each morning for about 5 days and then discontinue.  Also recommend using saline nasal spray several times daily and saline nasal irrigation (AYR is a common brand).  Use Flonase nasal spray each morning after using Afrin nasal spray and saline nasal irrigation. ?May take Delsym Cough Suppressant ("12 Hour Cough Relief") at bedtime for nighttime cough.  ?Stop all antihistamines for now, and other non-prescription cough/cold preparations. ?Use albuterol inhaler as needed. ?

## 2021-10-03 NOTE — ED Triage Notes (Signed)
Nasal congestion w/ cough x 2 weeks  ?No OTC meds  ?

## 2021-10-28 ENCOUNTER — Telehealth: Payer: Self-pay | Admitting: Physician Assistant

## 2021-10-28 DIAGNOSIS — I10 Essential (primary) hypertension: Secondary | ICD-10-CM

## 2021-11-03 MED ORDER — AMLODIPINE BESYLATE 10 MG PO TABS: 10.0000 mg | ORAL_TABLET | Freq: Every day | ORAL | 0 refills | Status: DC

## 2021-11-03 NOTE — Telephone Encounter (Signed)
Called pt made aware of RX sent to pharmacy 

## 2021-11-03 NOTE — Addendum Note (Signed)
Addended by: Lois Huxley, Jeannett Senior L on: 11/03/2021 03:18 PM ? ? Modules accepted: Orders ? ?

## 2021-11-03 NOTE — Telephone Encounter (Signed)
Patient called in to inquire about Rx refill that was called in last week FU visit scheduled for next available slot with Dr Laural Benes. Please advise  ?

## 2021-11-03 NOTE — Telephone Encounter (Signed)
Rx sent for amlodipine.

## 2022-01-19 ENCOUNTER — Emergency Department
Admission: EM | Admit: 2022-01-19 | Discharge: 2022-01-19 | Disposition: A | Discharge status: Home / Self Care | Payer: 59 | Source: Home / Self Care | Attending: Family Medicine | Admitting: Family Medicine

## 2022-01-19 ENCOUNTER — Encounter: Payer: Self-pay | Admitting: Emergency Medicine

## 2022-01-19 DIAGNOSIS — J441 Chronic obstructive pulmonary disease with (acute) exacerbation: Secondary | ICD-10-CM | POA: Diagnosis not present

## 2022-01-19 DIAGNOSIS — J111 Influenza due to unidentified influenza virus with other respiratory manifestations: Secondary | ICD-10-CM | POA: Diagnosis not present

## 2022-01-19 LAB — POC SARS CORONAVIRUS 2 AG -  ED: SARS Coronavirus 2 Ag: NEGATIVE

## 2022-01-19 MED ORDER — PREDNISONE 20 MG PO TABS: ORAL_TABLET | ORAL | 0 refills | Status: DC

## 2022-01-19 MED ORDER — AZITHROMYCIN 250 MG PO TABS: ORAL_TABLET | ORAL | 0 refills | Status: DC

## 2022-01-19 NOTE — Discharge Instructions (Signed)
Drink lots of water Continue TheraFlu as needed Take the antibiotic as directed. Take prednisone 2 times a day as directed Drink lots of water See your doctor if not improving by the end of the week

## 2022-01-19 NOTE — ED Triage Notes (Signed)
Patient states that he began w/body aches, cough, headache x 2 days.  No sense of taste or smell.  Patient has taken Thera-Flu.  Denies taken at home COVID test.
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
## 2022-01-23 ENCOUNTER — Other Ambulatory Visit: Payer: Self-pay | Admitting: Internal Medicine

## 2022-01-23 DIAGNOSIS — I10 Essential (primary) hypertension: Secondary | ICD-10-CM

## 2022-02-02 ENCOUNTER — Other Ambulatory Visit: Payer: Self-pay | Admitting: Internal Medicine

## 2022-02-02 DIAGNOSIS — I10 Essential (primary) hypertension: Secondary | ICD-10-CM

## 2022-02-07 IMAGING — CT CT CHEST LUNG CANCER SCREENING LOW DOSE W/O CM
1 of 2 series · 10 of 20 positions shown, 13 images · non-contrast
Comparison: None.

CLINICAL DATA: 55-year-old male current smoker, with greater than
30 pack-year history of smoking, for initial lung cancer screening

EXAM:
CT CHEST WITHOUT CONTRAST LOW-DOSE FOR LUNG CANCER SCREENING
TECHNIQUE: Multidetector CT imaging of the chest was performed following the
standard protocol without IV contrast.

[ct lung segmentation data · axial · 0.75mm/px · z∈[+1430,+1430]mm · 10 of 307 frames shown]
[frame 1/307  mediastinal]
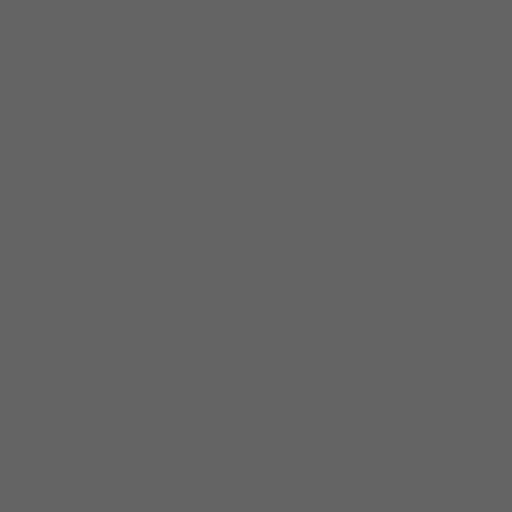
[frame 1/307  lung]
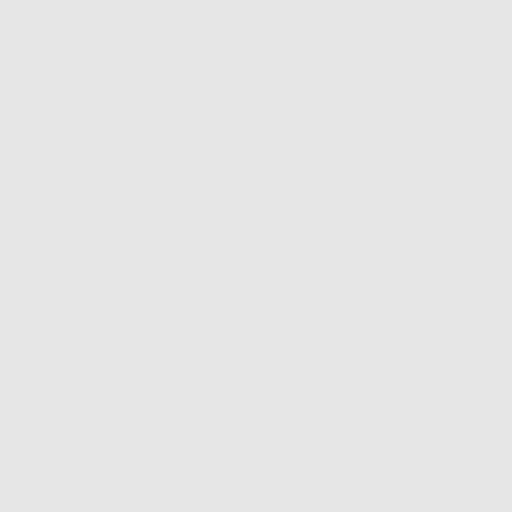
[frame 35/307  lung]
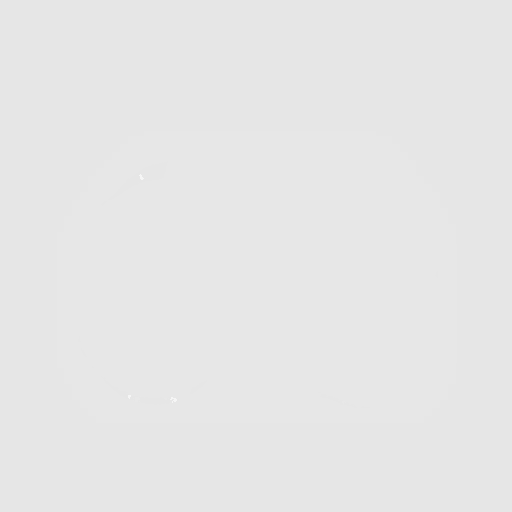
[frame 69/307  lung]
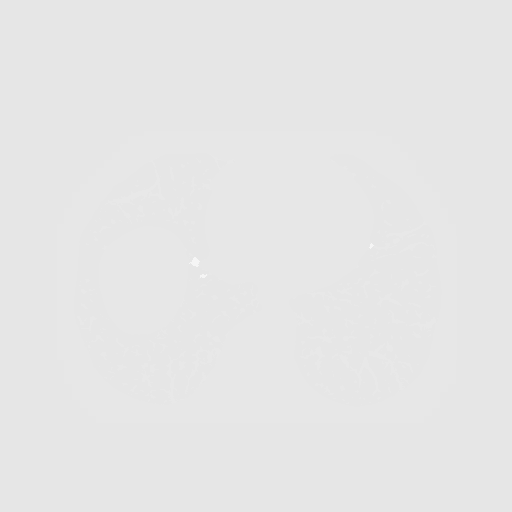
[frame 103/307  lung]
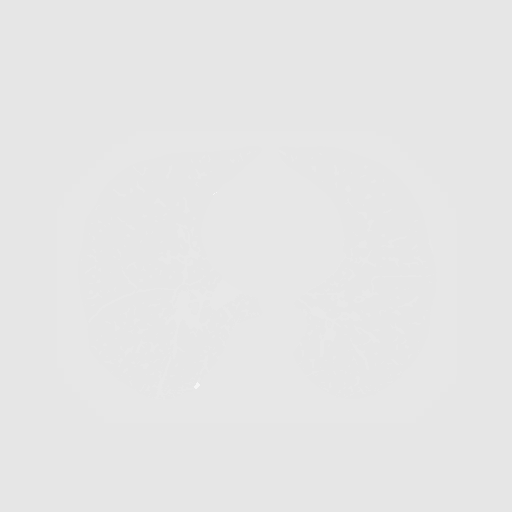
[frame 137/307  mediastinal]
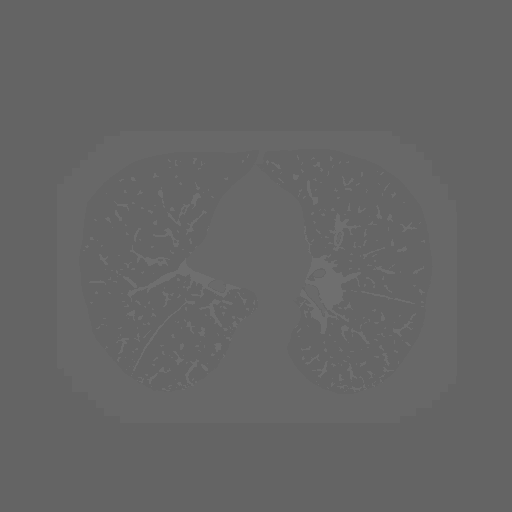
[frame 137/307  lung]
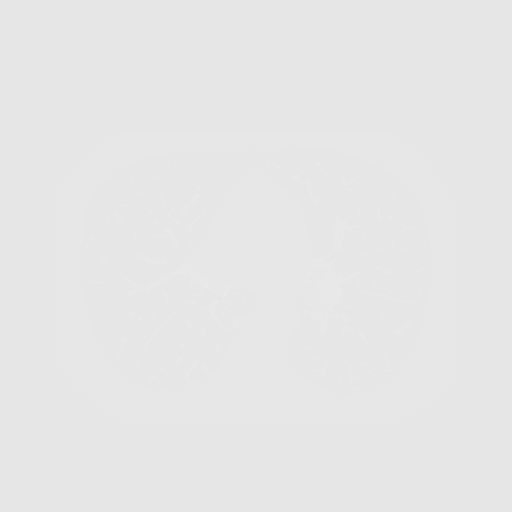
[frame 171/307  lung]
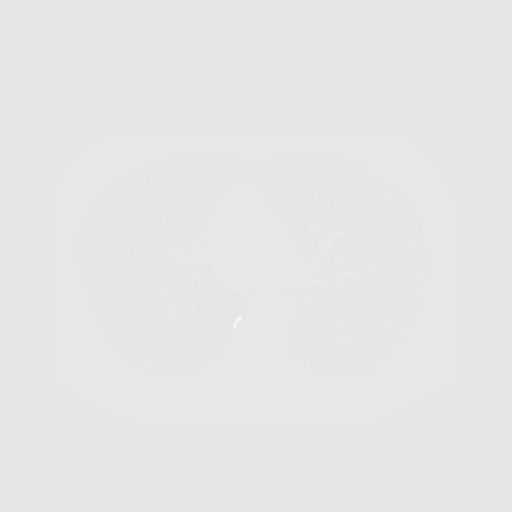
[frame 205/307  lung]
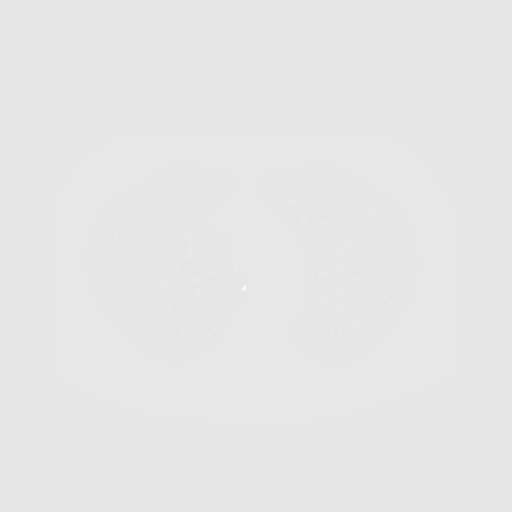
[frame 239/307  lung]
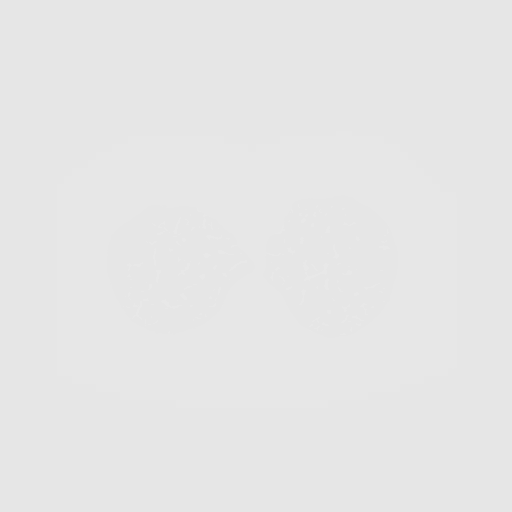
[frame 273/307  mediastinal]
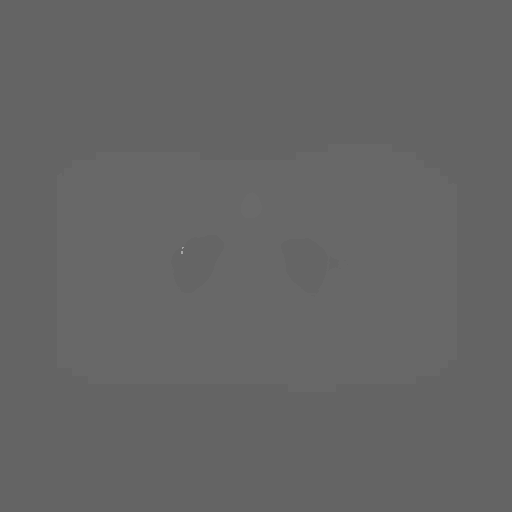
[frame 273/307  lung]
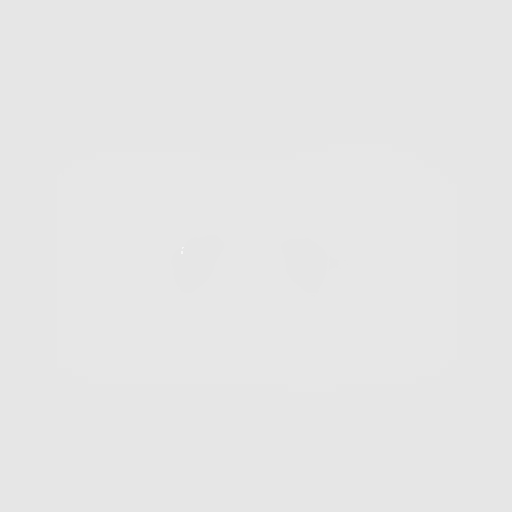
[frame 307/307  lung]
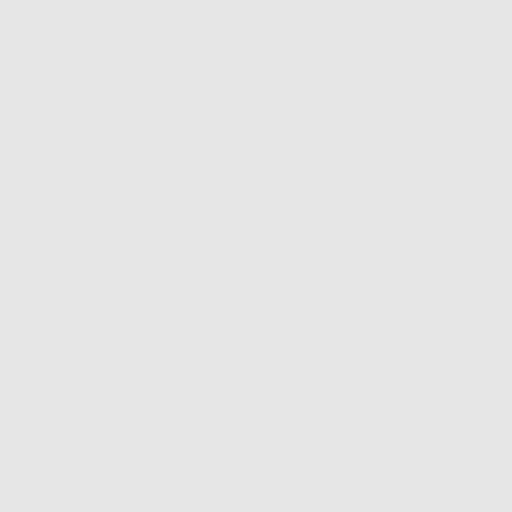

[10 of 20 positions shown; findings below may reference images not displayed]

FINDINGS: Cardiovascular: The heart is normal in size. No pericardial
effusion.

No evidence of thoracic aortic aneurysm.

Mild coronary atherosclerosis of the LAD and right coronary artery.

Mediastinum/Nodes: No suspicious mediastinal lymphadenopathy.

Visualized thyroid is unremarkable.

Lungs/Pleura: Moderate centrilobular and paraseptal emphysematous
changes, upper lung predominant.

No suspicious pulmonary nodules.

Linear/platelike scarring/atelectasis in the right middle lobe,
lingula, and bilateral lower lobes.

No focal consolidation.

No pleural effusion or pneumothorax.

Upper Abdomen: Visualized upper abdomen is grossly unremarkable.

Musculoskeletal: Visualized osseous structures are within normal
limits.
IMPRESSION: Lung-RADS 1, negative. Continue annual screening with low-dose chest
CT without contrast in 12 months.

Emphysema ([id number]).

## 2022-02-09 NOTE — Addendum Note (Signed)
Encounter addended by: Novella Olive on: 02/09/2022 12:02 PM  Actions taken: Letter saved

## 2022-02-16 ENCOUNTER — Ambulatory Visit: Payer: 59 | Attending: Internal Medicine | Admitting: Internal Medicine

## 2022-02-16 VITALS — BP 120/80 | HR 75 | Wt 166.2 lb

## 2022-02-16 DIAGNOSIS — Z125 Encounter for screening for malignant neoplasm of prostate: Secondary | ICD-10-CM

## 2022-02-16 DIAGNOSIS — J449 Chronic obstructive pulmonary disease, unspecified: Secondary | ICD-10-CM | POA: Diagnosis not present

## 2022-02-16 DIAGNOSIS — Z122 Encounter for screening for malignant neoplasm of respiratory organs: Secondary | ICD-10-CM

## 2022-02-16 DIAGNOSIS — I10 Essential (primary) hypertension: Secondary | ICD-10-CM | POA: Diagnosis not present

## 2022-02-16 DIAGNOSIS — Z720 Tobacco use: Secondary | ICD-10-CM | POA: Diagnosis not present

## 2022-02-16 DIAGNOSIS — E785 Hyperlipidemia, unspecified: Secondary | ICD-10-CM

## 2022-02-16 MED ORDER — VARENICLINE TARTRATE 0.5 MG X 11 & 1 MG X 42 PO TBPK
ORAL_TABLET | ORAL | 0 refills | Status: DC
Start: 2022-02-16 — End: 2023-05-10

## 2022-02-16 MED ORDER — FLUTICASONE-SALMETEROL 100-50 MCG/ACT IN AEPB: 1.0000 | INHALATION_SPRAY | Freq: Two times a day (BID) | RESPIRATORY_TRACT | 6 refills | Status: DC

## 2022-02-16 MED ORDER — ATORVASTATIN CALCIUM 10 MG PO TABS
10.0000 mg | ORAL_TABLET | Freq: Every day | ORAL | 1 refills | Status: DC
Start: 2022-02-16 — End: 2022-08-12

## 2022-02-16 MED ORDER — AMLODIPINE BESYLATE 10 MG PO TABS: 10.0000 mg | ORAL_TABLET | Freq: Every day | ORAL | 1 refills | Status: DC

## 2022-02-16 MED ORDER — ALBUTEROL SULFATE HFA 108 (90 BASE) MCG/ACT IN AERS: 2.0000 | INHALATION_SPRAY | Freq: Four times a day (QID) | RESPIRATORY_TRACT | 6 refills | Status: DC | PRN

## 2022-02-16 NOTE — Patient Instructions (Addendum)
I have sent refills on your medications. I have sent the prescription for Chantix to help you quit smoking.  Start with the initiation pack.  Once completed, start the continuation pack.

## 2022-02-16 NOTE — Progress Notes (Signed)
Patient ID: Connor Oneal, male    DOB: Sep 24, 1965  MRN: [id number]  CC: Hypertension and Medication Refill   Subjective: Connor Oneal is a 56 y.o. male who presents for chronic ds management.  Last seen 12/2020 His concerns today include:  history of hepatitis C that has been cured with Harvoni as of 2016, COPD, tob depen, HL and HTN.  HYPERTENSION Currently taking: see medication list Med Adherence: [x]  Yes -Norvasc 10 mg Medication side effects: []  Yes    [x]  No Adherence with salt restriction: [x]  Yes    []  No Home Monitoring?: []  Yes    [x]  No, no device Monitoring Frequency:  Home BP results range:  SOB? [x]  Yes when he smokes    Chest Pain?: []  Yes    [x]  No Leg swelling?: []  Yes    [x]  No Headaches?: []  Yes    [x]  No Dizziness? []  Yes    [x]  No Comments:   HL:  out of Lipitor, not sure how long.  Last LDL was 71  COPD:  using Albuterol twice a day and Advair as needed. Still smoking 1 pk a day. Smoked for over 38 yrs.  Ready to quit but "its easier said than done."  Feels he would have to be in the hospital for several days to be able to quit.  Tried patches in the past and did not find it helpful.  HM:  declines Shingrix.  Agrees to tdapt vaccine but we are out.  Agrees to lung cancer screen with low dose CT Patient Active Problem List   Diagnosis Date Noted   COVID-19 vaccination declined 12/19/2020   Vitamin D deficiency 12/19/2020   Liver fibrosis 10/21/2017   Hyperlipidemia 03/10/2017   HTN (hypertension) 03/12/2014   Tobacco abuse 09/25/2011   COPD (chronic obstructive pulmonary disease) (HCC) 09/08/2011   Hepatitis C virus infection cured after antiviral drug therapy 04/22/2011     Current Outpatient Medications on File Prior to Visit  Medication Sig Dispense Refill   ibuprofen (ADVIL) 600 MG tablet Take 1 tablet (600 mg total) by mouth every 6 (six) hours as needed. 30 tablet 0   Multiple Vitamin (MULTIVITAMIN) tablet Take 1 tablet by mouth  daily.     Vitamin D, Ergocalciferol, (DRISDOL) 1.25 MG (50000 UNIT) CAPS capsule Take 1 capsule (50,000 Units total) by mouth every 7 (seven) days. 16 capsule 0   No current facility-administered medications on file prior to visit.    Allergies  Allergen Reactions   Hctz [Hydrochlorothiazide]     Causes nightmares    Social History   Socioeconomic History   Marital status: Single    Spouse name: Not on file   Number of children: Not on file   Years of education: Not on file   Highest education level: Not on file  Occupational History   Not on file  Tobacco Use   Smoking status: Every Day    Packs/day: 1.00    Years: 30.00    Total pack years: 30.00    Types: Cigarettes   Smokeless tobacco: Never  Vaping Use   Vaping Use: Never used  Substance and Sexual Activity   Alcohol use: No    Alcohol/week: 0.0 standard drinks of alcohol    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   Drug use: Not Currently    Comment: 11-30-2007  quit   Sexual activity: Yes  Other Topics Concern   Not on file  Social History Narrative   Patient has  unprotected sex with multiple partners within a months time   Social Determinants of Health   Financial Resource Strain: Not on file  Food Insecurity: Not on file  Transportation Needs: Not on file  Physical Activity: Not on file  Stress: Not on file  Social Connections: Not on file  Intimate Partner Violence: Not on file    Family History  Problem Relation Age of Onset   COPD Mother        smoker   COPD Father        smoker--Lung transplant    Past Surgical History:  Procedure Laterality Date   CIRCUMCISION N/A 05/23/2018   Procedure: CIRCUMCISION ADULT;  Surgeon: Ihor Gully, MD;  Location: [facility];  Service: Urology;  Laterality: N/A;  ONLY NEEDS 45 MIN   LIVER BIOPSY  2015    ROS: Review of Systems Negative except as stated above  PHYSICAL EXAM: BP 120/80   Pulse 75   Wt 166 lb 3.2 oz (75.4 kg)    SpO2 93%   BMI 25.27 kg/m   Wt Readings from Last 3 Encounters:  02/16/22 166 lb 3.2 oz (75.4 kg)  01/19/22 165 lb (74.8 kg)  10/03/21 160 lb (72.6 kg)    Physical Exam  General appearance - alert, well appearing, and in no distress Mental status -patient was looking down scrolling through his phone the entire time that I was trying to take the history from him. Eyes -pink conjunctiva Neck - supple, no significant adenopathy Chest - clear to auscultation, no wheezes, rales or rhonchi, symmetric air entry Heart - normal rate, regular rhythm, normal S1, S2, no murmurs, rubs, clicks or gallops Extremities - peripheral pulses normal, no pedal edema, no clubbing or cyanosis      Latest Ref Rng & Units 09/18/2020    4:21 PM 05/23/2018    9:19 AM 05/05/2018    2:59 PM  CMP  Glucose 65 - 99 mg/dL 78  160  95   BUN 6 - 24 mg/dL 4   5   Creatinine 7.37 - 1.27 mg/dL 1.06   2.69   Sodium 485 - 144 mmol/L 142  142  142   Potassium 3.5 - 5.2 mmol/L 3.8  3.7  4.0   Chloride 96 - 106 mmol/L 103   103   CO2 20 - 29 mmol/L 21   24   Calcium 8.7 - 10.2 mg/dL 9.6   9.4   Total Protein 6.0 - 8.5 g/dL 6.8   6.9   Total Bilirubin 0.0 - 1.2 mg/dL 0.3   0.2   Alkaline Phos 44 - 121 IU/L 64   66   AST 0 - 40 IU/L 10   12   ALT 0 - 44 IU/L 12   8    Lipid Panel     Component Value Date/Time   CHOL 136 12/19/2020 1656   TRIG 64 12/19/2020 1656   HDL 52 12/19/2020 1656   CHOLHDL 2.6 12/19/2020 1656   CHOLHDL 4 01/11/2014 1617   VLDL 16.8 01/11/2014 1617   LDLCALC 71 12/19/2020 1656    CBC    Component Value Date/Time   WBC 10.6 05/05/2018 1459   WBC 7.2 10/31/2014 1049   RBC 5.10 05/05/2018 1459   RBC 5.27 10/31/2014 1049   HGB 16.3 05/23/2018 0919   HGB 13.7 05/05/2018 1459   HCT 48.0 05/23/2018 0919   HCT 41.4 05/05/2018 1459   PLT 299 05/05/2018 1459   MCV 81 05/05/2018  1459   MCH 26.9 05/05/2018 1459   MCH 28.3 10/31/2014 1049   MCHC 33.1 05/05/2018 1459   MCHC 34.1  10/31/2014 1049   RDW 13.2 05/05/2018 1459   LYMPHSABS 2.7 10/31/2014 1049   MONOABS 0.4 10/31/2014 1049   EOSABS 0.5 10/31/2014 1049   BASOSABS 0.1 10/31/2014 1049    ASSESSMENT AND PLAN:  1. Essential hypertension At goal.  Continue amlodipine 10 mg daily and low-salt diet. - CBC - Comprehensive metabolic panel - amLODipine (NORVASC) 10 MG tablet; Take 1 tablet (10 mg total) by mouth daily.  Dispense: 90 tablet; Refill: 1  2. Hyperlipidemia, unspecified hyperlipidemia type Refill atorvastatin.  Check lipid for follow-up today. - Lipid panel - atorvastatin (LIPITOR) 10 MG tablet; Take 1 tablet (10 mg total) by mouth daily.  Dispense: 90 tablet; Refill: 1  3. Chronic obstructive pulmonary disease, unspecified COPD type (HCC) Recommend that he uses the Advair twice a day.  In doing so he would find that he does not you have to use the albuterol as often - fluticasone-salmeterol (ADVAIR) 100-50 MCG/ACT AEPB; Inhale 1 puff into the lungs 2 (two) times daily.  Dispense: 1 each; Refill: 6 - albuterol (VENTOLIN HFA) 108 (90 Base) MCG/ACT inhaler; Inhale 2 puffs into the lungs every 6 (six) hours as needed for wheezing or shortness of breath.  Dispense: 8 g; Refill: 6  4. Tobacco abuse Pt is current smoker. Patient advised to quit smoking. Discussed health risks associated with smoking including lung and other types of cancers, chronic lung diseases and CV risks.. Pt ready/not ready to give trail of quitting.   Discussed methods to help quit including use of NRT, Chantix and Bupropion.  Pt wanting to try: Chantix.  Prescription sent to his pharmacy.  Patient advised that the medication can cause bad dreams. _3_ Minutes spent on counseling. F/U: We will assess progress on next visit.  - varenicline (CHANTIX PAK) 0.5 MG X 11 & 1 MG X 42 tablet; Take one 0.5 mg tablet by mouth once daily for 3 days, then increase to one 0.5 mg tablet twice daily for 4 days, then increase to one 1 mg tablet  twice daily.  Dispense: 53 tablet; Refill: 0  5. Prostate cancer screening Patient agreeable to prostate cancer screening. - PSA  6. Screening for lung cancer Patient agreeable to lung cancer screening again.  He is greater than 20-pack-year smoker and is over the age of 56. - CT CHEST LUNG CA SCREEN LOW DOSE W/O CM; Future    Patient was given the opportunity to ask questions.  Patient verbalized understanding of the plan and was able to repeat key elements of the plan.   This documentation was completed using Paediatric nurse.  Any transcriptional errors are unintentional.  Orders Placed This Encounter  Procedures   CT CHEST LUNG CA SCREEN LOW DOSE W/O CM   CBC   Comprehensive metabolic panel   Lipid panel   PSA     Requested Prescriptions   Signed Prescriptions Disp Refills   fluticasone-salmeterol (ADVAIR) 100-50 MCG/ACT AEPB 1 each 6    Sig: Inhale 1 puff into the lungs 2 (two) times daily.   atorvastatin (LIPITOR) 10 MG tablet 90 tablet 1    Sig: Take 1 tablet (10 mg total) by mouth daily.   albuterol (VENTOLIN HFA) 108 (90 Base) MCG/ACT inhaler 8 g 6    Sig: Inhale 2 puffs into the lungs every 6 (six) hours as needed for wheezing or shortness  of breath.   amLODipine (NORVASC) 10 MG tablet 90 tablet 1    Sig: Take 1 tablet (10 mg total) by mouth daily.   varenicline (CHANTIX PAK) 0.5 MG X 11 & 1 MG X 42 tablet 53 tablet 0    Sig: Take one 0.5 mg tablet by mouth once daily for 3 days, then increase to one 0.5 mg tablet twice daily for 4 days, then increase to one 1 mg tablet twice daily.    Return in about 4 months (around 06/19/2022).  Jonah Blue, MD, FACP

## 2022-02-17 LAB — CBC
Hematocrit: 42.4 % (ref 37.5–51.0)
Hemoglobin: 14.7 g/dL (ref 13.0–17.7)
MCH: 28.1 pg (ref 26.6–33.0)
MCHC: 34.7 g/dL (ref 31.5–35.7)
MCV: 81 fL (ref 79–97)
Platelets: 284 10*3/uL (ref 150–450)
RBC: 5.24 x10E6/uL (ref 4.14–5.80)
RDW: 13 % (ref 11.6–15.4)
WBC: 6.6 10*3/uL (ref 3.4–10.8)

## 2022-02-17 LAB — LIPID PANEL
Chol/HDL Ratio: 3.5 ratio (ref 0.0–5.0)
Cholesterol, Total: 184 mg/dL (ref 100–199)
HDL: 53 mg/dL (ref >39)
LDL Chol Calc (NIH): 117 mg/dL — ABNORMAL HIGH (ref 0–99)
Triglycerides: 78 mg/dL (ref 0–149)
VLDL Cholesterol Cal: 14 mg/dL (ref 5–40)

## 2022-02-17 LAB — COMPREHENSIVE METABOLIC PANEL
ALT: 13 IU/L (ref 0–44)
AST: 12 IU/L (ref 0–40)
Albumin/Globulin Ratio: 2.2 (ref 1.2–2.2)
Albumin: 4.7 g/dL (ref 3.8–4.9)
Alkaline Phosphatase: 64 IU/L (ref 44–121)
BUN/Creatinine Ratio: 7 — ABNORMAL LOW (ref 9–20)
BUN: 7 mg/dL (ref 6–24)
Bilirubin Total: 0.2 mg/dL (ref 0.0–1.2)
CO2: 21 mmol/L (ref 20–29)
Calcium: 9.6 mg/dL (ref 8.7–10.2)
Chloride: 101 mmol/L (ref 96–106)
Creatinine, Ser: 0.98 mg/dL (ref 0.76–1.27)
Globulin, Total: 2.1 g/dL (ref 1.5–4.5)
Glucose: 94 mg/dL (ref 70–99)
Potassium: 3.9 mmol/L (ref 3.5–5.2)
Sodium: 140 mmol/L (ref 134–144)
Total Protein: 6.8 g/dL (ref 6.0–8.5)
eGFR: 91 mL/min/{1.73_m2} (ref >59)

## 2022-02-17 LAB — PSA: Prostate Specific Ag, Serum: 0.9 ng/mL (ref 0.0–4.0)

## 2022-03-23 ENCOUNTER — Other Ambulatory Visit: Payer: 59

## 2022-03-23 ENCOUNTER — Ambulatory Visit
Admission: RE | Admit: 2022-03-23 | Discharge: 2022-03-23 | Disposition: A | Discharge status: Home / Self Care | Payer: 59 | Source: Ambulatory Visit | Attending: Internal Medicine | Admitting: Internal Medicine

## 2022-03-23 DIAGNOSIS — Z122 Encounter for screening for malignant neoplasm of respiratory organs: Secondary | ICD-10-CM

## 2022-04-15 ENCOUNTER — Ambulatory Visit
Admission: EM | Admit: 2022-04-15 | Discharge: 2022-04-15 | Disposition: A | Discharge status: Home / Self Care | Payer: 59 | Attending: Family Medicine | Admitting: Family Medicine

## 2022-04-15 ENCOUNTER — Encounter: Payer: Self-pay | Admitting: Emergency Medicine

## 2022-04-15 DIAGNOSIS — J3489 Other specified disorders of nose and nasal sinuses: Secondary | ICD-10-CM | POA: Diagnosis not present

## 2022-04-15 DIAGNOSIS — J0100 Acute maxillary sinusitis, unspecified: Secondary | ICD-10-CM

## 2022-04-15 DIAGNOSIS — J309 Allergic rhinitis, unspecified: Secondary | ICD-10-CM | POA: Diagnosis not present

## 2022-04-15 MED ORDER — FEXOFENADINE HCL 180 MG PO TABS: 180.0000 mg | ORAL_TABLET | Freq: Every day | ORAL | 0 refills | Status: DC

## 2022-04-15 MED ORDER — AMOXICILLIN-POT CLAVULANATE 875-125 MG PO TABS: 1.0000 | ORAL_TABLET | Freq: Two times a day (BID) | ORAL | 0 refills | Status: DC

## 2022-04-15 MED ORDER — PREDNISONE 20 MG PO TABS: ORAL_TABLET | ORAL | 0 refills | Status: DC
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
## 2022-04-15 NOTE — ED Triage Notes (Signed)
Complains of Nasal congestion, headache, cough for about a week and a half Taken thermaflu and hall cough drops

## 2022-04-15 NOTE — Discharge Instructions (Addendum)
Advised patient to take medication as directed with food to completion.  Instructed patient to take prednisone and Allegra with first dose of Augmentin for the next 5 of 7 days.  Advised may use Allegra as needed afterwards for concurrent postnasal drainage/drip.  Encouraged patient to increase daily water intake while taking these medications.  Advised patient if symptoms worsen and/or unresolved please follow-up with PCP or here for further evaluation.

## 2022-04-16 ENCOUNTER — Telehealth: Payer: Self-pay

## 2022-04-16 NOTE — Telephone Encounter (Signed)
TC to f/u after yesterday's visit to [facility]. No answer; left VM to call [phone] for problems or questions. 

## 2022-06-10 ENCOUNTER — Telehealth: Payer: Self-pay | Admitting: Pediatrics

## 2022-06-25 NOTE — Telephone Encounter (Signed)
Erroneous encounter.  Please disregard.   Enis Gash, MD [facility]

## 2022-08-12 ENCOUNTER — Other Ambulatory Visit: Payer: Self-pay | Admitting: Internal Medicine

## 2022-08-12 DIAGNOSIS — E785 Hyperlipidemia, unspecified: Secondary | ICD-10-CM

## 2022-08-12 NOTE — Telephone Encounter (Signed)
Requested Prescriptions  Pending Prescriptions Disp Refills   atorvastatin (LIPITOR) 10 MG tablet [Pharmacy Med Name: Atorvastatin Calcium 10 MG Oral Tablet] 90 tablet 0    Sig: Take 1 tablet by mouth once daily     Cardiovascular:  Antilipid - Statins Failed - 08/12/2022 10:40 AM      Failed - Lipid Panel in normal range within the last 12 months    Cholesterol, Total  Date Value Ref Range Status  02/16/2022 184 100 - 199 mg/dL Final   LDL Chol Calc (NIH)  Date Value Ref Range Status  02/16/2022 117 (H) 0 - 99 mg/dL Final   HDL  Date Value Ref Range Status  02/16/2022 53 >39 mg/dL Final   Triglycerides  Date Value Ref Range Status  02/16/2022 78 0 - 149 mg/dL Final         Passed - Patient is not pregnant      Passed - Valid encounter within last 12 months    Recent Outpatient Visits           5 months ago Essential hypertension   [place], Deborah B, MD   1 year ago Essential hypertension   [place], Deborah B, MD   1 year ago Weight loss   [place], Dionne Bucy, [place]   2 years ago Essential hypertension   [place], Stephen L, RPH-CPP   3 years ago Essential hypertension   [place], [place], NP

## 2022-08-20 ENCOUNTER — Other Ambulatory Visit: Payer: Self-pay | Admitting: Internal Medicine

## 2022-08-20 DIAGNOSIS — I10 Essential (primary) hypertension: Secondary | ICD-10-CM

## 2022-09-18 ENCOUNTER — Other Ambulatory Visit: Payer: Self-pay | Admitting: Internal Medicine

## 2022-09-18 DIAGNOSIS — I10 Essential (primary) hypertension: Secondary | ICD-10-CM

## 2022-09-18 DIAGNOSIS — E785 Hyperlipidemia, unspecified: Secondary | ICD-10-CM

## 2022-09-18 NOTE — Telephone Encounter (Signed)
Medication Refill - Medication: amLODipine (NORVASC) 10 MG tablet [id number]   atorvastatin (LIPITOR) 10 MG tablet [id number]   Has the patient contacted their pharmacy? No. (Agent: If no, request that the patient contact the pharmacy for the refill. If patient does not wish to contact the pharmacy document the reason why and proceed with request.) (Agent: If yes, when and what did the pharmacy advise?)  Preferred Pharmacy (with phone number or street name): [place], [id number] [place] [address] Phone: [phone]  Fax: [phone]  Has the patient been seen for an appointment in the last year OR does the patient have an upcoming appointment? Yes.  Apt schedule 06/24  Agent: Please be advised that RX refills may take up to 3 business days. We ask that you follow-up with your pharmacy.

## 2022-09-18 NOTE — Telephone Encounter (Signed)
Requested medication (s) are due for refill today: Yes  Requested medication (s) are on the active medication list: Yes  Last refill:  08/20/22  Future visit scheduled: No  Notes to clinic:  Left message to call and make appointment.    Requested Prescriptions  Pending Prescriptions Disp Refills   amLODipine (NORVASC) 10 MG tablet [Pharmacy Med Name: amLODIPine Besylate 10 MG Oral Tablet] 30 tablet 0    Sig: TAKE 1 TABLET BY MOUTH ONCE DAILY. MUST HAVE OFFICE VISIT FOR MORE REFILLS.     Cardiovascular: Calcium Channel Blockers 2 Failed - 09/18/2022 11:38 AM      Failed - Valid encounter within last 6 months    Recent Outpatient Visits           7 months ago Essential hypertension   [place], MD   1 year ago Essential hypertension   [place] Ladell Pier, MD   2 years ago Weight loss   [facility] [place], Dionne Bucy, [place]   3 years ago Essential hypertension   [place], Jarome Matin, RPH-CPP   3 years ago Essential hypertension   [place], [place], NP              Passed - Last BP in normal range    BP Readings from Last 1 Encounters:  04/15/22 121/82         Passed - Last Heart Rate in normal range    Pulse Readings from Last 1 Encounters:  04/15/22 80

## 2022-09-21 MED ORDER — ATORVASTATIN CALCIUM 10 MG PO TABS: 10.0000 mg | ORAL_TABLET | Freq: Every day | ORAL | 1 refills | Status: DC

## 2022-09-21 MED ORDER — AMLODIPINE BESYLATE 10 MG PO TABS: 10.0000 mg | ORAL_TABLET | Freq: Every day | ORAL | 0 refills | Status: DC

## 2022-09-21 NOTE — Telephone Encounter (Signed)
Patient must keep upcoming appointment for further refills. Requested Prescriptions  Pending Prescriptions Disp Refills   amLODipine (NORVASC) 10 MG tablet 90 tablet 0    Sig: Take 1 tablet (10 mg total) by mouth daily.     Cardiovascular: Calcium Channel Blockers 2 Failed - 09/18/2022  3:36 PM      Failed - Valid encounter within last 6 months    Recent Outpatient Visits           7 months ago Essential hypertension   [place], MD   1 year ago Essential hypertension   [place] Ladell Pier, MD   2 years ago Weight loss   [facility] [place], Dionne Bucy, [place]   3 years ago Essential hypertension   [place], Jarome Matin, RPH-CPP   3 years ago Essential hypertension   [place], [place], NP       Future Appointments             In 3 months Ladell Pier, MD [place] BP in normal range    BP Readings from Last 1 Encounters:  04/15/22 121/82         Passed - Last Heart Rate in normal range    Pulse Readings from Last 1 Encounters:  04/15/22 80          atorvastatin (LIPITOR) 10 MG tablet 90 tablet 1    Sig: Take 1 tablet (10 mg total) by mouth daily.     Cardiovascular:  Antilipid - Statins Failed - 09/18/2022  3:36 PM      Failed - Lipid Panel in normal range within the last 12 months    Cholesterol, Total  Date Value Ref Range Status  02/16/2022 184 100 - 199 mg/dL Final   LDL Chol Calc (NIH)  Date Value Ref Range Status  02/16/2022 117 (H) 0 - 99 mg/dL Final   HDL  Date Value Ref Range Status  02/16/2022 53 >39 mg/dL Final   Triglycerides  Date Value Ref Range Status  02/16/2022 78 0 - 149 mg/dL Final         Passed - Patient is not pregnant      Passed - Valid  encounter within last 12 months    Recent Outpatient Visits           7 months ago Essential hypertension   [place], MD   1 year ago Essential hypertension   [place], Deborah B, MD   2 years ago Weight loss   [facility] [place], [place], [place]   3 years ago Essential hypertension   [place], RPH-CPP   3 years ago Essential hypertension   [place], [place], NP       Future Appointments             In 3 months Wynetta Emery, Dalbert Batman, MD [place]

## 2022-10-05 ENCOUNTER — Ambulatory Visit (INDEPENDENT_AMBULATORY_CARE_PROVIDER_SITE_OTHER): Payer: Managed Care, Other (non HMO)

## 2022-10-05 ENCOUNTER — Ambulatory Visit
Admission: EM | Admit: 2022-10-05 | Discharge: 2022-10-05 | Disposition: A | Discharge status: Home / Self Care | Payer: Managed Care, Other (non HMO) | Attending: Urgent Care | Admitting: Urgent Care

## 2022-10-05 DIAGNOSIS — M545 Low back pain, unspecified: Secondary | ICD-10-CM

## 2022-10-05 DIAGNOSIS — S39012A Strain of muscle, fascia and tendon of lower back, initial encounter: Secondary | ICD-10-CM

## 2022-10-05 MED ORDER — MELOXICAM 7.5 MG PO TABS: 7.5000 mg | ORAL_TABLET | Freq: Every day | ORAL | 0 refills | Status: DC
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
## 2022-10-05 NOTE — Discharge Instructions (Addendum)
Your x-ray shows developing arthritis. It otherwise shows no acute changes. Please perform the home back exercises attached to this sheet. Please apply a microwavable heat pack to your lower back. You may need to wear a hard back brace while working. Please call physical therapy to schedule an assessment. Please take the meloxicam once daily in the morning with breakfast to help with your discomfort.
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
## 2023-01-05 ENCOUNTER — Encounter: Payer: Self-pay | Admitting: Internal Medicine

## 2023-01-05 ENCOUNTER — Ambulatory Visit: Payer: Managed Care, Other (non HMO) | Attending: Internal Medicine | Admitting: Internal Medicine

## 2023-01-05 ENCOUNTER — Other Ambulatory Visit (HOSPITAL_COMMUNITY)
Admission: RE | Admit: 2023-01-05 | Discharge: 2023-01-05 | Disposition: A | Discharge status: Home / Self Care | Payer: Managed Care, Other (non HMO) | Source: Ambulatory Visit | Attending: Internal Medicine | Admitting: Internal Medicine

## 2023-01-05 VITALS — BP 108/68 | HR 76 | Temp 98.9°F | Ht 68.0 in | Wt 169.0 lb

## 2023-01-05 DIAGNOSIS — J449 Chronic obstructive pulmonary disease, unspecified: Secondary | ICD-10-CM | POA: Diagnosis not present

## 2023-01-05 DIAGNOSIS — I10 Essential (primary) hypertension: Secondary | ICD-10-CM

## 2023-01-05 DIAGNOSIS — Z113 Encounter for screening for infections with a predominantly sexual mode of transmission: Secondary | ICD-10-CM | POA: Insufficient documentation

## 2023-01-05 DIAGNOSIS — E785 Hyperlipidemia, unspecified: Secondary | ICD-10-CM

## 2023-01-05 DIAGNOSIS — Z125 Encounter for screening for malignant neoplasm of prostate: Secondary | ICD-10-CM

## 2023-01-05 DIAGNOSIS — Z720 Tobacco use: Secondary | ICD-10-CM

## 2023-01-05 DIAGNOSIS — M545 Low back pain, unspecified: Secondary | ICD-10-CM

## 2023-01-05 DIAGNOSIS — R768 Other specified abnormal immunological findings in serum: Secondary | ICD-10-CM

## 2023-01-05 DIAGNOSIS — Z114 Encounter for screening for human immunodeficiency virus [HIV]: Secondary | ICD-10-CM

## 2023-01-05 DIAGNOSIS — G8929 Other chronic pain: Secondary | ICD-10-CM

## 2023-01-05 DIAGNOSIS — G4700 Insomnia, unspecified: Secondary | ICD-10-CM

## 2023-01-05 DIAGNOSIS — Z1159 Encounter for screening for other viral diseases: Secondary | ICD-10-CM

## 2023-01-05 MED ORDER — AMLODIPINE BESYLATE 10 MG PO TABS: 10.0000 mg | ORAL_TABLET | Freq: Every day | ORAL | 2 refills | Status: DC

## 2023-01-05 MED ORDER — ATORVASTATIN CALCIUM 10 MG PO TABS: 10.0000 mg | ORAL_TABLET | Freq: Every day | ORAL | 2 refills | Status: DC
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
## 2023-01-06 LAB — COMPREHENSIVE METABOLIC PANEL
ALT: 11 IU/L (ref 0–44)
AST: 9 IU/L (ref 0–40)
Albumin/Globulin Ratio: 1.6 (ref 1.2–2.2)
Albumin: 4.7 g/dL (ref 3.8–4.9)
Alkaline Phosphatase: 79 IU/L (ref 44–121)
BUN/Creatinine Ratio: 5 — ABNORMAL LOW (ref 9–20)
BUN: 6 mg/dL (ref 6–24)
Bilirubin Total: 0.4 mg/dL (ref 0.0–1.2)
CO2: 25 mmol/L (ref 20–29)
Calcium: 9.7 mg/dL (ref 8.7–10.2)
Chloride: 97 mmol/L (ref 96–106)
Creatinine, Ser: 1.15 mg/dL (ref 0.76–1.27)
Globulin, Total: 2.9 g/dL (ref 1.5–4.5)
Glucose: 76 mg/dL (ref 70–99)
Potassium: 4 mmol/L (ref 3.5–5.2)
Sodium: 137 mmol/L (ref 134–144)
Total Protein: 7.6 g/dL (ref 6.0–8.5)
eGFR: 74 mL/min/{1.73_m2} (ref >59)

## 2023-01-06 LAB — LIPID PANEL
Chol/HDL Ratio: 3.2 ratio (ref 0.0–5.0)
Cholesterol, Total: 176 mg/dL (ref 100–199)
HDL: 55 mg/dL (ref >39)
LDL Chol Calc (NIH): 106 mg/dL — ABNORMAL HIGH (ref 0–99)
Triglycerides: 81 mg/dL (ref 0–149)
VLDL Cholesterol Cal: 15 mg/dL (ref 5–40)

## 2023-01-06 LAB — CBC
Hematocrit: 45.9 % (ref 37.5–51.0)
Hemoglobin: 15.4 g/dL (ref 13.0–17.7)
MCH: 27.3 pg (ref 26.6–33.0)
MCHC: 33.6 g/dL (ref 31.5–35.7)
MCV: 81 fL (ref 79–97)
Platelets: 270 10*3/uL (ref 150–450)
RBC: 5.65 x10E6/uL (ref 4.14–5.80)
RDW: 13 % (ref 11.6–15.4)
WBC: 5.5 10*3/uL (ref 3.4–10.8)

## 2023-01-06 LAB — TREPONEMAL ANTIBODIES, TPPA: Treponemal Antibodies, TPPA: NONREACTIVE

## 2023-01-06 LAB — HEPATITIS C ANTIBODY: Hep C Virus Ab: REACTIVE — AB

## 2023-01-06 LAB — RPR W/REFLEX TO TREPSURE: RPR: NONREACTIVE

## 2023-01-06 LAB — HIV ANTIBODY (ROUTINE TESTING W REFLEX): HIV Screen 4th Generation wRfx: NONREACTIVE

## 2023-01-06 LAB — PSA: Prostate Specific Ag, Serum: 0.9 ng/mL (ref 0.0–4.0)

## 2023-01-06 NOTE — Addendum Note (Signed)
Addended by: Jonah Blue B on: 01/06/2023 09:14 AM   Modules accepted: Orders

## 2023-01-07 LAB — URINE CYTOLOGY ANCILLARY ONLY
Chlamydia: NEGATIVE
Comment: NEGATIVE
Comment: NEGATIVE
Comment: NORMAL
Neisseria Gonorrhea: NEGATIVE
Trichomonas: NEGATIVE

## 2023-01-08 LAB — SPECIMEN STATUS REPORT

## 2023-01-08 LAB — HCV RNA QUANT: Hepatitis C Quantitation: NOT DETECTED IU/mL

## 2023-01-09 ENCOUNTER — Other Ambulatory Visit: Payer: Self-pay | Admitting: Internal Medicine

## 2023-01-09 DIAGNOSIS — J449 Chronic obstructive pulmonary disease, unspecified: Secondary | ICD-10-CM

## 2023-01-11 NOTE — Telephone Encounter (Signed)
Requested Prescriptions  Pending Prescriptions Disp Refills   ADVAIR DISKUS 100-50 MCG/ACT AEPB [Pharmacy Med Name: Advair Diskus 100-50 MCG/DOSE Inhalation Aerosol Powder Breath Activated] 60 each 3    Sig: INHALE 1 PUFF BY MOUTH TWICE DAILY     Pulmonology:  Combination Products Passed - 01/09/2023 11:33 AM      Passed - Valid encounter within last 12 months    Recent Outpatient Visits           6 days ago Essential hypertension   [place] [facility] Marcine Matar, MD   10 months ago Essential hypertension   [place] [facility] Marcine Matar, MD   2 years ago Essential hypertension   [place] [facility] Marcine Matar, MD   2 years ago Weight loss   [facility] [place], Marzella Schlein, [place]   3 years ago Essential hypertension    [facility] Drucilla Chalet, RPH-CPP       Future Appointments             In 3 months Laural Benes, Binnie Rail, MD [facility]

## 2023-01-13 ENCOUNTER — Other Ambulatory Visit: Payer: Self-pay

## 2023-01-22 ENCOUNTER — Encounter: Payer: Self-pay | Admitting: Physical Medicine & Rehabilitation

## 2023-02-01 ENCOUNTER — Encounter: Payer: Self-pay | Admitting: Internal Medicine

## 2023-03-09 ENCOUNTER — Encounter: Payer: Managed Care, Other (non HMO) | Admitting: Physical Medicine & Rehabilitation

## 2023-05-10 ENCOUNTER — Ambulatory Visit: Payer: Managed Care, Other (non HMO) | Attending: Internal Medicine | Admitting: Internal Medicine

## 2023-05-10 ENCOUNTER — Encounter: Payer: Self-pay | Admitting: Internal Medicine

## 2023-05-10 ENCOUNTER — Other Ambulatory Visit: Payer: Self-pay | Admitting: Internal Medicine

## 2023-05-10 VITALS — BP 111/73 | HR 72 | Temp 98.2°F | Ht 68.0 in | Wt 164.0 lb

## 2023-05-10 DIAGNOSIS — E785 Hyperlipidemia, unspecified: Secondary | ICD-10-CM | POA: Diagnosis not present

## 2023-05-10 DIAGNOSIS — Z5320 Procedure and treatment not carried out because of patient's decision for unspecified reasons: Secondary | ICD-10-CM

## 2023-05-10 DIAGNOSIS — J449 Chronic obstructive pulmonary disease, unspecified: Secondary | ICD-10-CM

## 2023-05-10 DIAGNOSIS — Z720 Tobacco use: Secondary | ICD-10-CM

## 2023-05-10 DIAGNOSIS — S7011XA Contusion of right thigh, initial encounter: Secondary | ICD-10-CM

## 2023-05-10 DIAGNOSIS — F17210 Nicotine dependence, cigarettes, uncomplicated: Secondary | ICD-10-CM

## 2023-05-10 DIAGNOSIS — I10 Essential (primary) hypertension: Secondary | ICD-10-CM | POA: Diagnosis not present

## 2023-05-10 DIAGNOSIS — Z2821 Immunization not carried out because of patient refusal: Secondary | ICD-10-CM

## 2023-05-10 DIAGNOSIS — R0609 Other forms of dyspnea: Secondary | ICD-10-CM | POA: Diagnosis not present

## 2023-05-10 MED ORDER — ALBUTEROL SULFATE HFA 108 (90 BASE) MCG/ACT IN AERS: 2.0000 | INHALATION_SPRAY | Freq: Four times a day (QID) | RESPIRATORY_TRACT | 6 refills | Status: DC | PRN

## 2023-05-10 MED ORDER — FLUTICASONE-SALMETEROL 100-50 MCG/ACT IN AEPB: 1.0000 | INHALATION_SPRAY | Freq: Two times a day (BID) | RESPIRATORY_TRACT | 12 refills | Status: DC

## 2023-05-10 MED ORDER — TIOTROPIUM BROMIDE MONOHYDRATE 18 MCG IN CAPS: 18.0000 ug | ORAL_CAPSULE | Freq: Every day | RESPIRATORY_TRACT | 12 refills | Status: DC

## 2023-05-10 NOTE — Progress Notes (Addendum)
Patient ID: Connor Oneal, male    DOB: 1966-02-14  MRN: [id number]  CC: Hypertension (HTN f/u. Med refill. /Waking up with SOB - pt suspects sleep apnea /)   Subjective: Connor Oneal is a 57 y.o. male who presents for chronic ds management. His concerns today include:  history of hepatitis C that has been cured with Harvoni as of 2016, COPD, tob depen, HL and HTN.   HTN:  on Norvasc 10 mg and taking consistently Does not check BP Wakes up with SOB at nights about 3 times a wk.  Occurs when the room gets too warm.  Sleeps with AC on 74.  Gets SOB when taking his trash can to the street which is about 200 feet.  No CP.  No LE edema. Sleeps on 2 pillows.   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  Did not report it to his supervisor.  Area is bruised and tender.  Has not had an updated Td booster.   HL: Taking and tolerating Lipitor. LDL on last visit in June was 106 but was out of Lipitor for 10 days prior to that visit  COPD/tob dep: on Advair and Albuterol inhalers.  Using Advair only once a day instead of BID as prescribed.  Using Albuterol 5-6 times a day. Cough productive of clear mucous in the mornings.  Still smoking a little over a pack a day.  Not ready to quit.  Smoked for about 40 years.  Had CT for lung cancer screening in August of last year.  Declines doing it again this year.  HM:  declines flu shot and shingles vaccine. Declines Tdapt. Patient Active Problem List   Diagnosis Date Noted   COVID-19 vaccination declined 12/19/2020   Vitamin D deficiency 12/19/2020   Liver fibrosis 10/21/2017   Hyperlipidemia 03/10/2017   HTN (hypertension) 03/12/2014   Tobacco abuse 09/25/2011   COPD (chronic obstructive pulmonary disease) (HCC) 09/08/2011   Hepatitis C virus infection cured after antiviral drug therapy 04/22/2011     Current Outpatient Medications on File Prior to Visit  Medication Sig Dispense  Refill   amLODipine (NORVASC) 10 MG tablet Take 1 tablet (10 mg total) by mouth daily. 90 tablet 2   atorvastatin (LIPITOR) 10 MG tablet Take 1 tablet (10 mg total) by mouth daily. 90 tablet 2   fexofenadine (ALLEGRA ALLERGY) 180 MG tablet Take 1 tablet (180 mg total) by mouth daily for 15 days. 15 tablet 0   meloxicam (MOBIC) 7.5 MG tablet Take 1 tablet (7.5 mg total) by mouth daily. (Patient not taking: Reported on 01/05/2023) 30 tablet 0   Vitamin D, Ergocalciferol, (DRISDOL) 1.25 MG (50000 UNIT) CAPS capsule Take 1 capsule (50,000 Units total) by mouth every 7 (seven) days. (Patient not taking: Reported on 04/15/2022) 16 capsule 0   No current facility-administered medications on file prior to visit.    Allergies  Allergen Reactions   Hctz [Hydrochlorothiazide]     Causes nightmares    Social History   Socioeconomic History   Marital status: Single    Spouse name: Not on file   Number of children: Not on file   Years of education: Not on file   Highest education level: Not on file  Occupational History   Not on file  Tobacco Use   Smoking status: Every Day    Current packs/day: 1.00    Average packs/day: 1 pack/day  for 30.0 years (30.0 ttl pk-yrs)    Types: Cigarettes   Smokeless tobacco: Never  Vaping Use   Vaping status: Never Used  Substance and Sexual Activity   Alcohol use: No    Alcohol/week: 0.0 standard drinks of alcohol    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   Drug use: Not Currently    Comment: 11-30-2007  quit   Sexual activity: Yes  Other Topics Concern   Not on file  Social History Narrative   Patient has unprotected sex with multiple partners within a months time   Social Determinants of Health   Financial Resource Strain: Medium Risk (05/10/2023)   Overall Financial Resource Strain (CARDIA)    Difficulty of Paying Living Expenses: Somewhat hard  Food Insecurity: Food Insecurity Present (05/10/2023)   Hunger Vital Sign    Worried About Running  Out of Food in the Last Year: Sometimes true    Ran Out of Food in the Last Year: Never true  Transportation Needs: No Transportation Needs (05/10/2023)   PRAPARE - Administrator, Civil Service (Medical): No    Lack of Transportation (Non-Medical): No  Physical Activity: Sufficiently Active (05/10/2023)   Exercise Vital Sign    Days of Exercise per Week: 5 days    Minutes of Exercise per Session: 60 min  Stress: No Stress Concern Present (05/10/2023)   [employer] of Occupational Health - Occupational Stress Questionnaire    Feeling of Stress : Not at all  Social Connections: Moderately Integrated (05/10/2023)   Social Connection and Isolation Panel [NHANES]    Frequency of Communication with Friends and Family: More than three times a week    Frequency of Social Gatherings with Friends and Family: More than three times a week    Attends Religious Services: More than 4 times per year    Active Member of Clubs or Organizations: Yes    Attends Banker Meetings: More than 4 times per year    Marital Status: Divorced  Intimate Partner Violence: Not At Risk (05/10/2023)   Humiliation, Afraid, Rape, and Kick questionnaire    Fear of Current or Ex-Partner: No    Emotionally Abused: No    Physically Abused: No    Sexually Abused: No    Family History  Problem Relation Age of Onset   COPD Mother        smoker   COPD Father        smoker--Lung transplant    Past Surgical History:  Procedure Laterality Date   CIRCUMCISION N/A 05/23/2018   Procedure: CIRCUMCISION ADULT;  Surgeon: Ihor Gully, MD;  Location: [facility];  Service: Urology;  Laterality: N/A;  ONLY NEEDS 45 MIN   LIVER BIOPSY  2015    ROS: Review of Systems Negative except as stated above  PHYSICAL EXAM: BP 111/73 (BP Location: Left Arm, Patient Position: Sitting, Cuff Size: Normal)   Pulse 72   Temp 98.2 F (36.8 C) (Oral)   Ht 5\' 8"  (1.727 m)   Wt 164 lb (74.4 kg)    SpO2 95%   BMI 24.94 kg/m   Physical Exam  General appearance - alert, well appearing, and in no distress Mental status - normal mood, behavior, speech, dress, motor activity, and thought processes Neck - supple, no significant adenopathy Chest - breath sounds mildly decreased BL.  No wheezes or crackles. Heart - normal rate, regular rhythm, normal S1, S2, no murmurs, rubs, clicks or gallops Extremities - peripheral pulses normal,  no pedal edema, no clubbing or cyanosis Skin: Patient has an area of ecchymosis about the size of an orange on the right anterior thigh.  Firm to touch. See picture below.     Latest Ref Rng & Units 01/05/2023    4:37 PM 02/16/2022    4:51 PM 09/18/2020    4:21 PM  CMP  Glucose 70 - 99 mg/dL 76  94  78   BUN 6 - 24 mg/dL 6  7  4    Creatinine 0.76 - 1.27 mg/dL 4.09  8.11  9.14   Sodium 134 - 144 mmol/L 137  140  142   Potassium 3.5 - 5.2 mmol/L 4.0  3.9  3.8   Chloride 96 - 106 mmol/L 97  101  103   CO2 20 - 29 mmol/L 25  21  21    Calcium 8.7 - 10.2 mg/dL 9.7  9.6  9.6   Total Protein 6.0 - 8.5 g/dL 7.6  6.8  6.8   Total Bilirubin 0.0 - 1.2 mg/dL 0.4  0.2  0.3   Alkaline Phos 44 - 121 IU/L 79  64  64   AST 0 - 40 IU/L 9  12  10    ALT 0 - 44 IU/L 11  13  12     Lipid Panel     Component Value Date/Time   CHOL 176 01/05/2023 1637   TRIG 81 01/05/2023 1637   HDL 55 01/05/2023 1637   CHOLHDL 3.2 01/05/2023 1637   CHOLHDL 4 01/11/2014 1617   VLDL 16.8 01/11/2014 1617   LDLCALC 106 (H) 01/05/2023 1637    CBC    Component Value Date/Time   WBC 5.5 01/05/2023 1637   WBC 7.2 10/31/2014 1049   RBC 5.65 01/05/2023 1637   RBC 5.27 10/31/2014 1049   HGB 15.4 01/05/2023 1637   HCT 45.9 01/05/2023 1637   PLT 270 01/05/2023 1637   MCV 81 01/05/2023 1637   MCH 27.3 01/05/2023 1637   MCH 28.3 10/31/2014 1049   MCHC 33.6 01/05/2023 1637   MCHC 34.1 10/31/2014 1049   RDW 13.0 01/05/2023 1637   LYMPHSABS 2.7 10/31/2014 1049   MONOABS 0.4 10/31/2014 1049    EOSABS 0.5 10/31/2014 1049   BASOSABS 0.1 10/31/2014 1049    ASSESSMENT AND PLAN: 1. Essential hypertension At goal.  Continue Norvasc 10 mg daily.  2. Chronic obstructive pulmonary disease, unspecified COPD type (HCC) Patient COPD not well-controlled as evidenced by the fact that he is using albuterol 5-6 times a day and dyspnea with minimal exertion. Advised that he use the Advair twice a day as prescribed. Add Spiriva to use once a day. - albuterol (VENTOLIN HFA) 108 (90 Base) MCG/ACT inhaler; Inhale 2 puffs into the lungs every 6 (six) hours as needed for wheezing or shortness of breath.  Dispense: 8 g; Refill: 6 - tiotropium (SPIRIVA HANDIHALER) 18 MCG inhalation capsule; Place 1 capsule (18 mcg total) into inhaler and inhale daily.  Dispense: 30 capsule; Refill: 12 - fluticasone-salmeterol (ADVAIR) 100-50 MCG/ACT AEPB; Inhale 1 puff into the lungs 2 (two) times daily.  Dispense: 1 each; Refill: 12  3. DOE (dyspnea on exertion) Differential diagnosis include uncontrolled COPD versus CHF versus anginal equivalent.  I think the former is most likely.  She changes made to inhalers under #2.  Advised patient that if this does not improve with using the Advair twice a day and adding Spiriva, he should follow-up with me before his next visit which is scheduled for 4  months.  - Brain natriuretic peptide  4. Hyperlipidemia, unspecified hyperlipidemia type Continue atorvastatin 10 mg daily.  5. Hematoma of right thigh, initial encounter Recommend warm compresses.  This should resolve as the blood is reabsorbed into the muscle.  Advised patient that it is always a good idea to report any injury that occurred at work to his supervisor.  6. Influenza vaccination declined Recommended.  Patient declined.  7. Tobacco abuse Strongly encouraged him to quit smoking.  He is not ready to give a trial of quitting but states he will try to cut back.  8. Lung cancer screening declined by  patient   9. Tetanus, diphtheria, and acellular pertussis (Tdap) vaccination declined  Patient was given the opportunity to ask questions.  Patient verbalized understanding of the plan and was able to repeat key elements of the plan.   This documentation was completed using Paediatric nurse.  Any transcriptional errors are unintentional.  Orders Placed This Encounter  Procedures   Brain natriuretic peptide     Requested Prescriptions   Signed Prescriptions Disp Refills   albuterol (VENTOLIN HFA) 108 (90 Base) MCG/ACT inhaler 8 g 6    Sig: Inhale 2 puffs into the lungs every 6 (six) hours as needed for wheezing or shortness of breath.   tiotropium (SPIRIVA HANDIHALER) 18 MCG inhalation capsule 30 capsule 12    Sig: Place 1 capsule (18 mcg total) into inhaler and inhale daily.   fluticasone-salmeterol (ADVAIR) 100-50 MCG/ACT AEPB 1 each 12    Sig: Inhale 1 puff into the lungs 2 (two) times daily.    Return in about 4 months (around 09/10/2023) for physical.  Jonah Blue, MD, Jerrel Ivory

## 2023-05-10 NOTE — Patient Instructions (Signed)
Use a warm compress to the bruise on your right thigh.  It will resolve on its own.  Use the Advair inhaler twice a day not once a day.  We have added another inhaler called Spiriva that you will use once a day.  Let me know if the waking up with shortness of breath and the shortness of breath when walking still persists despite using the inhalers consistently. If it does, please come back to see me much sooner than 4 months.

## 2023-05-11 LAB — BRAIN NATRIURETIC PEPTIDE: BNP: 4.1 pg/mL (ref 0.0–100.0)

## 2023-10-02 ENCOUNTER — Other Ambulatory Visit: Payer: Self-pay | Admitting: Internal Medicine

## 2023-10-02 DIAGNOSIS — E785 Hyperlipidemia, unspecified: Secondary | ICD-10-CM

## 2023-10-02 DIAGNOSIS — I10 Essential (primary) hypertension: Secondary | ICD-10-CM

## 2023-10-04 NOTE — Telephone Encounter (Signed)
 Requested Prescriptions  Pending Prescriptions Disp Refills   atorvastatin (LIPITOR) 10 MG tablet [Pharmacy Med Name: Atorvastatin Calcium 10 MG Oral Tablet] 90 tablet 0    Sig: Take 1 tablet by mouth once daily     Cardiovascular:  Antilipid - Statins Failed - 10/04/2023  2:16 PM      Failed - Lipid Panel in normal range within the last 12 months    Cholesterol, Total  Date Value Ref Range Status  01/05/2023 176 100 - 199 mg/dL Final   LDL Chol Calc (NIH)  Date Value Ref Range Status  01/05/2023 106 (H) 0 - 99 mg/dL Final   HDL  Date Value Ref Range Status  01/05/2023 55 >39 mg/dL Final   Triglycerides  Date Value Ref Range Status  01/05/2023 81 0 - 149 mg/dL Final         Passed - Patient is not pregnant      Passed - Valid encounter within last 12 months    Recent Outpatient Visits           4 months ago Essential hypertension   [facility] [place]. [facility] Marcine Matar, MD   9 months ago Essential hypertension   [facility] [place] [facility] [place]. [facility] Marcine Matar, MD   1 year ago Essential hypertension   [facility] [place] [facility] [place]. [facility] Marcine Matar, MD   2 years ago Essential hypertension   [facility] [place]. [facility] Marcine Matar, MD   3 years ago Weight loss   [facility] [place]. [facility], Marylene Land M, PA-C               amLODipine (NORVASC) 10 MG tablet [Pharmacy Med Name: amLODIPine Besylate 10 MG Oral Tablet] 90 tablet 0    Sig: Take 1 tablet by mouth once daily     Cardiovascular: Calcium Channel Blockers 2 Passed - 10/04/2023  2:16 PM      Passed - Last BP in normal range    BP Readings from Last 1 Encounters:  05/10/23 111/73         Passed - Last Heart Rate in normal range    Pulse Readings from Last  1 Encounters:  05/10/23 72         Passed - Valid encounter within last 6 months    Recent Outpatient Visits           4 months ago Essential hypertension   [facility] [place]. [facility] Marcine Matar, MD   9 months ago Essential hypertension   [facility] [place]. [facility] Marcine Matar, MD   1 year ago Essential hypertension   [facility] [place] [facility] Dept Of [place]. [facility] Marcine Matar, MD   2 years ago Essential hypertension   [facility] [place] [facility] [place]. [facility] Marcine Matar, MD   3 years ago Weight loss   [facility] [place] [facility] Dept Of [place]. [facility] [place], [place], [place]

## 2023-10-20 ENCOUNTER — Other Ambulatory Visit: Payer: Self-pay | Admitting: Internal Medicine

## 2023-10-20 DIAGNOSIS — J449 Chronic obstructive pulmonary disease, unspecified: Secondary | ICD-10-CM

## 2023-11-12 ENCOUNTER — Encounter: Payer: Self-pay | Admitting: Internal Medicine

## 2023-11-12 ENCOUNTER — Ambulatory Visit: Attending: Internal Medicine | Admitting: Internal Medicine

## 2023-11-12 VITALS — BP 117/75 | HR 73 | Temp 98.2°F | Ht 68.0 in | Wt 163.0 lb

## 2023-11-12 DIAGNOSIS — I10 Essential (primary) hypertension: Secondary | ICD-10-CM

## 2023-11-12 DIAGNOSIS — J449 Chronic obstructive pulmonary disease, unspecified: Secondary | ICD-10-CM

## 2023-11-12 DIAGNOSIS — R200 Anesthesia of skin: Secondary | ICD-10-CM

## 2023-11-12 DIAGNOSIS — Z114 Encounter for screening for human immunodeficiency virus [HIV]: Secondary | ICD-10-CM | POA: Diagnosis not present

## 2023-11-12 DIAGNOSIS — J9611 Chronic respiratory failure with hypoxia: Secondary | ICD-10-CM

## 2023-11-12 DIAGNOSIS — Z79899 Other long term (current) drug therapy: Secondary | ICD-10-CM | POA: Insufficient documentation

## 2023-11-12 DIAGNOSIS — Z0000 Encounter for general adult medical examination without abnormal findings: Secondary | ICD-10-CM | POA: Diagnosis not present

## 2023-11-12 DIAGNOSIS — F17210 Nicotine dependence, cigarettes, uncomplicated: Secondary | ICD-10-CM | POA: Insufficient documentation

## 2023-11-12 DIAGNOSIS — E785 Hyperlipidemia, unspecified: Secondary | ICD-10-CM

## 2023-11-12 DIAGNOSIS — Z720 Tobacco use: Secondary | ICD-10-CM

## 2023-11-12 DIAGNOSIS — Z122 Encounter for screening for malignant neoplasm of respiratory organs: Secondary | ICD-10-CM

## 2023-11-12 DIAGNOSIS — Z2821 Immunization not carried out because of patient refusal: Secondary | ICD-10-CM

## 2023-11-12 DIAGNOSIS — Z8619 Personal history of other infectious and parasitic diseases: Secondary | ICD-10-CM | POA: Insufficient documentation

## 2023-11-12 MED ORDER — BUPROPION HCL ER (SR) 100 MG PO TB12: 100.0000 mg | ORAL_TABLET | Freq: Two times a day (BID) | ORAL | 4 refills | Status: DC

## 2023-11-12 MED ORDER — ATORVASTATIN CALCIUM 10 MG PO TABS: 10.0000 mg | ORAL_TABLET | Freq: Every day | ORAL | 1 refills | Status: DC

## 2023-11-12 MED ORDER — FLUTICASONE-SALMETEROL 250-50 MCG/ACT IN AEPB: 1.0000 | INHALATION_SPRAY | Freq: Two times a day (BID) | RESPIRATORY_TRACT | 11 refills | Status: DC

## 2023-11-12 MED ORDER — AMLODIPINE BESYLATE 10 MG PO TABS: 10.0000 mg | ORAL_TABLET | Freq: Every day | ORAL | 1 refills | Status: DC

## 2023-11-12 NOTE — Patient Instructions (Signed)
 I strongly encourage you to discontinue smoking. I have sent the prescription to your pharmacy for the medication called bupropion to help you quit smoking.  You meet criteria for oxygen 2 L when up ambulating.  You do not need it when sitting/resting. We will send the prescription to the medical supply company for the oxygen and to get you a nebulizer machine for you to use as needed.  I have referred you to a lung specialist known as pulmonologist.  I have sent an updated prescription to your pharmacy for the Advair inhaler.  We have increased the dose to 250/50.  Please remember to use it twice a day.  You will be called with the appointment for the CAT scan of your chest for lung cancer screening.

## 2023-11-12 NOTE — Progress Notes (Signed)
 Patient ID: Connor Oneal, male    DOB: 11-30-1965  MRN: [id number]  CC: Annual Exam (Physical. Med refills/O2 levels are low - requesting oxygen/ )   Subjective: Connor Oneal is a 58 y.o. male who presents for chronic ds management. His concerns today include:  history of hepatitis C that has been cured with Harvoni as of 2016, COPD, tob depen, HL and HTN.  Discussed the use of AI scribe software for clinical note transcription with the patient, who gave verbal consent to proceed.  Discussed the use of AI scribe software for clinical note transcription with the patient, who gave verbal consent to proceed.  History of Present Illness   Connor Oneal, a long-term smoker, presents for a routine physical.   HTN:  His blood pressure is well-controlled on daily amlodipine, and he reports limiting his salt intake. He is also on atorvastatin for cholesterol management and tolerating the med. CP only with coughing.   Tob dep: Despite his desire to quit smoking, he continues to smoke heavily, at a rate of one and a half packs a day. He has tried various cessation aids, including patches and Chantix, but found them ineffective or intolerable due to side effects.  CODP:  He has COPD, for which he uses albuterol, Advair 100/50, and Spiriva inhalers. He admits to sometimes forgetting his second dose of Advair in the afternoon or evening. He has recently started using his albuterol inhaler more frequently, up to two to three times a day, especially in the mornings when the air is dry. He endorses chronic cough with occasional phlegm production. -thinks he needs O2 or nebulizer.  He also reports intermittent numbness in his left leg just above and below the popliteal fossa over the past four to five months, lasting about 45 seconds to a minute each time. He denies any associated cramping.     HM:  declines PCV and singrix.  Yes to LDCT for lung CA screening  Patient Active Problem List    Diagnosis Date Noted   COVID-19 vaccination declined 12/19/2020   Vitamin D deficiency 12/19/2020   Liver fibrosis 10/21/2017   Hyperlipidemia 03/10/2017   HTN (hypertension) 03/12/2014   Tobacco abuse 09/25/2011   COPD (chronic obstructive pulmonary disease) (HCC) 09/08/2011   Hepatitis C virus infection cured after antiviral drug therapy 04/22/2011     Current Outpatient Medications on File Prior to Visit  Medication Sig Dispense Refill   albuterol (VENTOLIN HFA) 108 (90 Base) MCG/ACT inhaler INHALE 2 PUFFS BY MOUTH EVERY 6 HOURS AS NEEDED FOR WHEEZING FOR SHORTNESS OF BREATH 8.5 g 0   meloxicam (MOBIC) 7.5 MG tablet Take 1 tablet (7.5 mg total) by mouth daily. 30 tablet 0   tiotropium (SPIRIVA HANDIHALER) 18 MCG inhalation capsule Place 1 capsule (18 mcg total) into inhaler and inhale daily. 30 capsule 12   fexofenadine (ALLEGRA ALLERGY) 180 MG tablet Take 1 tablet (180 mg total) by mouth daily for 15 days. 15 tablet 0   Vitamin D, Ergocalciferol, (DRISDOL) 1.25 MG (50000 UNIT) CAPS capsule Take 1 capsule (50,000 Units total) by mouth every 7 (seven) days. (Patient not taking: Reported on 04/15/2022) 16 capsule 0   No current facility-administered medications on file prior to visit.    Allergies  Allergen Reactions   Hctz [Hydrochlorothiazide]     Causes nightmares    Social History   Socioeconomic History   Marital status: Single    Spouse name: Not on file   Number of children:  Not on file   Years of education: Not on file   Highest education level: Not on file  Occupational History   Not on file  Tobacco Use   Smoking status: Every Day    Current packs/day: 1.00    Average packs/day: 1 pack/day for 30.0 years (30.0 ttl pk-yrs)    Types: Cigarettes   Smokeless tobacco: Never  Vaping Use   Vaping status: Never Used  Substance and Sexual Activity   Alcohol use: No    Alcohol/week: 0.0 standard drinks of alcohol    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   Drug  use: Not Currently    Comment: 11-30-2007  quit   Sexual activity: Yes  Other Topics Concern   Not on file  Social History Narrative   Patient has unprotected sex with multiple partners within a months time   Social Drivers of Health   Financial Resource Strain: Medium Risk (05/10/2023)   Overall Financial Resource Strain (CARDIA)    Difficulty of Paying Living Expenses: Somewhat hard  Food Insecurity: Food Insecurity Present (05/10/2023)   Hunger Vital Sign    Worried About Running Out of Food in the Last Year: Sometimes true    Ran Out of Food in the Last Year: Never true  Transportation Needs: No Transportation Needs (05/10/2023)   PRAPARE - Administrator, Civil Service (Medical): No    Lack of Transportation (Non-Medical): No  Physical Activity: Insufficiently Active (11/12/2023)   Exercise Vital Sign    Days of Exercise per Week: 1 day    Minutes of Exercise per Session: 80 min  Stress: No Stress Concern Present (11/12/2023)   [employer] of Occupational Health - Occupational Stress Questionnaire    Feeling of Stress : Not at all  Social Connections: Unknown (11/12/2023)   Social Connection and Isolation Panel [NHANES]    Frequency of Communication with Friends and Family: More than three times a week    Frequency of Social Gatherings with Friends and Family: Once a week    Attends Religious Services: More than 4 times per year    Active Member of [employer] or Organizations: Yes    Attends Banker Meetings: More than 4 times per year    Marital Status: Patient declined  Intimate Partner Violence: Not At Risk (05/10/2023)   Humiliation, Afraid, Rape, and Kick questionnaire    Fear of Current or Ex-Partner: No    Emotionally Abused: No    Physically Abused: No    Sexually Abused: No    Family History  Problem Relation Age of Onset   COPD Mother        smoker   COPD Father        smoker--Lung transplant    Past Surgical History:  Procedure  Laterality Date   CIRCUMCISION N/A 05/23/2018   Procedure: CIRCUMCISION ADULT;  Surgeon: Ihor Gully, MD;  Location: [facility];  Service: Urology;  Laterality: N/A;  ONLY NEEDS 45 MIN   LIVER BIOPSY  2015    ROS: Review of Systems  HENT:  Negative for hearing loss and sore throat.        Wears dentures  Eyes:  Negative for visual disturbance.  Cardiovascular:  Negative for leg swelling.  Gastrointestinal:  Negative for abdominal pain and blood in stool.  Genitourinary:  Negative for difficulty urinating and dysuria.       Request HIV test.  Musculoskeletal:  Negative for joint swelling.     PHYSICAL EXAM:  BP 117/75 (BP Location: Left Arm, Patient Position: Sitting, Cuff Size: Normal)   Pulse 73   Temp 98.2 F (36.8 C) (Oral)   Ht 5\' 8"  (1.727 m)   Wt 163 lb (73.9 kg)   SpO2 92%   BMI 24.78 kg/m   Physical Exam Room air -pulse ox at rest 92%, pulse ox with ambulation 87 to 90% with a dip to 85% that lasted just a few seconds. 2 L of O2: 93% at rest, 92 to 93% with ambulation  General appearance - alert, well appearing, older than American male and in no distress Mental status -patient appears tired.   Eyes - pupils equal and reactive, extraocular eye movements intact Ears - bilateral TM's and external ear canals normal Nose - normal and patent, no erythema, discharge or polyps Mouth -he is wearing dentures above and below.  Throat is without erythema or exudates.   Neck - supple, no significant adenopathy Lymphatics - no palpable lymphadenopathy, no hepatosplenomegaly Chest -breath sounds mild to moderately decreased bilaterally but equal.  Mild scattered wheezing post ambulation Heart - normal rate, regular rhythm, normal S1, S2, no murmurs, rubs, clicks or gallops Abdomen - soft, nontender, nondistended, no masses or organomegaly Neurological -cranial nerves grossly intact.  Power 5/5 in both upper and lower extremities proximally and distally.  Gross  sensation intact. Extremities -no lower extremity edema.  Posterior tibialis pulses 3+ bilaterally      11/12/2023    1:43 PM 05/10/2023    4:16 PM 01/05/2023    3:47 PM  Depression screen PHQ 2/9  Decreased Interest 0 0 0  Down, Depressed, Hopeless 0 0 0  PHQ - 2 Score 0 0 0  Altered sleeping 0 3 3  Tired, decreased energy 2 1 0  Change in appetite 2 0 0  Feeling bad or failure about yourself  0 0 0  Trouble concentrating 0 0 0  Moving slowly or fidgety/restless 0 0 0  Suicidal thoughts 0 0 0  PHQ-9 Score 4 4 3   Difficult doing work/chores Not difficult at all Not difficult at all       11/12/2023    1:43 PM 05/10/2023    4:16 PM 01/05/2023    3:47 PM 02/16/2022    4:25 PM  GAD 7 : Generalized Anxiety Score  Nervous, Anxious, on Edge 0 0 0 0  Control/stop worrying 0 0 0 0  Worry too much - different things 0 0 0 0  Trouble relaxing 0 1 0 1  Restless 0 0 0 0  Easily annoyed or irritable 0 1 0 0  Afraid - awful might happen 0 0 0 0  Total GAD 7 Score 0 2 0 1  Anxiety Difficulty Not difficult at all Not difficult at all          Latest Ref Rng & Units 01/05/2023    4:37 PM 02/16/2022    4:51 PM 09/18/2020    4:21 PM  CMP  Glucose 70 - 99 mg/dL 76  94  78   BUN 6 - 24 mg/dL 6  7  4    Creatinine 0.76 - 1.27 mg/dL 0.86  5.78  4.69   Sodium 134 - 144 mmol/L 137  140  142   Potassium 3.5 - 5.2 mmol/L 4.0  3.9  3.8   Chloride 96 - 106 mmol/L 97  101  103   CO2 20 - 29 mmol/L 25  21  21    Calcium 8.7 - 10.2 mg/dL  9.7  9.6  9.6   Total Protein 6.0 - 8.5 g/dL 7.6  6.8  6.8   Total Bilirubin 0.0 - 1.2 mg/dL 0.4  0.2  0.3   Alkaline Phos 44 - 121 IU/L 79  64  64   AST 0 - 40 IU/L 9  12  10    ALT 0 - 44 IU/L 11  13  12     Lipid Panel     Component Value Date/Time   CHOL 176 01/05/2023 1637   TRIG 81 01/05/2023 1637   HDL 55 01/05/2023 1637   CHOLHDL 3.2 01/05/2023 1637   CHOLHDL 4 01/11/2014 1617   VLDL 16.8 01/11/2014 1617   LDLCALC 106 (H) 01/05/2023 1637    CBC     Component Value Date/Time   WBC 5.5 01/05/2023 1637   WBC 7.2 10/31/2014 1049   RBC 5.65 01/05/2023 1637   RBC 5.27 10/31/2014 1049   HGB 15.4 01/05/2023 1637   HCT 45.9 01/05/2023 1637   PLT 270 01/05/2023 1637   MCV 81 01/05/2023 1637   MCH 27.3 01/05/2023 1637   MCH 28.3 10/31/2014 1049   MCHC 33.6 01/05/2023 1637   MCHC 34.1 10/31/2014 1049   RDW 13.0 01/05/2023 1637   LYMPHSABS 2.7 10/31/2014 1049   MONOABS 0.4 10/31/2014 1049   EOSABS 0.5 10/31/2014 1049   BASOSABS 0.1 10/31/2014 1049    ASSESSMENT AND PLAN: 1. Annual physical exam (Primary)  2. Essential hypertension At goal.  Continue Norvasc - amLODipine (NORVASC) 10 MG tablet; Take 1 tablet (10 mg total) by mouth daily.  Dispense: 90 tablet; Refill: 1  3. Chronic obstructive pulmonary disease, unspecified COPD type (HCC) Strongly advised smoking cessation. Strongly encouraged him to use the Advair twice a day as prescribed.  We will increase the dose of the Advair to 250/50.  Continue Spiriva. Prescription sent to medical supply store to get a nebulizer device for him.  Will get him in with pulmonary as well. - For home use only DME Nebulizer machine - Ambulatory referral to Pulmonology - For home use only DME oxygen - fluticasone-salmeterol (ADVAIR) 250-50 MCG/ACT AEPB; Inhale 1 puff into the lungs in the morning and at bedtime.  Dispense: 60 each; Refill: 11  4. Chronic hypoxic respiratory failure (HCC) Due to COPD.  Meets criteria for supplemental O2 with ambulation.  Will send prescription to [facility]. Advised of the dangers of smoking around an oxygen tank.  5. Hyperlipidemia, unspecified hyperlipidemia type - atorvastatin (LIPITOR) 10 MG tablet; Take 1 tablet (10 mg total) by mouth daily.  Dispense: 90 tablet; Refill: 1  6. Tobacco abuse Pt is current smoker. Patient advised to quit smoking. Discussed health risks associated with smoking including lung and other types of cancers, chronic lung  diseases and CV risks.. Pt ready to give trail of quitting.   Discussed methods to help quit including quitting cold [place], use of NRT, Chantix and Bupropion.  He reports bad dreams with Chantix and nicotine patches have burned his skin.  He is willing to try bupropion/Zyban.  Encouraged him to set a quit date. Pt wanting to try: Bupropion _3_ Minutes spent on counseling. F/U: Reassess progress on subsequent visit in 3 months.  - buPROPion ER (WELLBUTRIN SR) 100 MG 12 hr tablet; Take 1 tablet (100 mg total) by mouth 2 (two) times daily.  Dispense: 60 tablet; Refill: 4  7. Numbness in left leg Of questionable etiology but exam normal today.  We will observe for now.  8.  Pneumococcal vaccination declined Recommended.  Patient declined.  9. Herpes zoster vaccination declined Recommended.  Patient declined.  10. Screening for HIV (human immunodeficiency virus) - HIV antibody (with reflex)  11. Screening for lung cancer - CT CHEST LUNG CA SCREEN LOW DOSE W/O CM; Future    Patient was given the opportunity to ask questions.  Patient verbalized understanding of the plan and was able to repeat key elements of the plan.   This documentation was completed using Paediatric nurse.  Any transcriptional errors are unintentional.  Orders Placed This Encounter  Procedures   For home use only DME Nebulizer machine   For home use only DME oxygen   CT CHEST LUNG CA SCREEN LOW DOSE W/O CM   HIV antibody (with reflex)   Ambulatory referral to Pulmonology     Requested Prescriptions   Signed Prescriptions Disp Refills   amLODipine (NORVASC) 10 MG tablet 90 tablet 1    Sig: Take 1 tablet (10 mg total) by mouth daily.   atorvastatin (LIPITOR) 10 MG tablet 90 tablet 1    Sig: Take 1 tablet (10 mg total) by mouth daily.   buPROPion ER (WELLBUTRIN SR) 100 MG 12 hr tablet 60 tablet 4    Sig: Take 1 tablet (100 mg total) by mouth 2 (two) times daily.   fluticasone-salmeterol  (ADVAIR) 250-50 MCG/ACT AEPB 60 each 11    Sig: Inhale 1 puff into the lungs in the morning and at bedtime.    Return in about 3 months (around 02/11/2024).  Jonah Blue, MD, FACP

## 2023-11-13 ENCOUNTER — Encounter: Payer: Self-pay | Admitting: Internal Medicine

## 2023-11-13 LAB — HIV ANTIBODY (ROUTINE TESTING W REFLEX): HIV Screen 4th Generation wRfx: NONREACTIVE

## 2023-11-21 ENCOUNTER — Other Ambulatory Visit: Payer: Self-pay | Admitting: Internal Medicine

## 2023-11-21 DIAGNOSIS — J449 Chronic obstructive pulmonary disease, unspecified: Secondary | ICD-10-CM

## 2023-11-25 ENCOUNTER — Encounter (HOSPITAL_BASED_OUTPATIENT_CLINIC_OR_DEPARTMENT_OTHER): Payer: Self-pay

## 2023-12-01 ENCOUNTER — Ambulatory Visit
Admission: RE | Admit: 2023-12-01 | Discharge: 2023-12-01 | Disposition: A | Discharge status: Home / Self Care | Source: Ambulatory Visit | Attending: Internal Medicine | Admitting: Internal Medicine

## 2023-12-01 DIAGNOSIS — Z122 Encounter for screening for malignant neoplasm of respiratory organs: Secondary | ICD-10-CM

## 2023-12-17 ENCOUNTER — Encounter: Admitting: Internal Medicine

## 2023-12-28 ENCOUNTER — Ambulatory Visit: Payer: Self-pay | Admitting: Internal Medicine

## 2023-12-29 ENCOUNTER — Other Ambulatory Visit: Payer: Self-pay | Admitting: Internal Medicine

## 2023-12-29 MED ORDER — ALBUTEROL SULFATE (2.5 MG/3ML) 0.083% IN NEBU: 2.5000 mg | INHALATION_SOLUTION | Freq: Four times a day (QID) | RESPIRATORY_TRACT | 1 refills | Status: AC | PRN
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
## 2024-02-08 ENCOUNTER — Ambulatory Visit (INDEPENDENT_AMBULATORY_CARE_PROVIDER_SITE_OTHER): Admitting: Internal Medicine

## 2024-02-08 ENCOUNTER — Encounter: Payer: Self-pay | Admitting: Internal Medicine

## 2024-02-08 VITALS — BP 126/72 | HR 79 | Temp 98.2°F | Ht 68.0 in | Wt 159.6 lb

## 2024-02-08 DIAGNOSIS — J449 Chronic obstructive pulmonary disease, unspecified: Secondary | ICD-10-CM

## 2024-02-08 DIAGNOSIS — F17210 Nicotine dependence, cigarettes, uncomplicated: Secondary | ICD-10-CM | POA: Diagnosis not present

## 2024-02-08 MED ORDER — TRELEGY ELLIPTA 100-62.5-25 MCG/ACT IN AEPB: 1.0000 | INHALATION_SPRAY | Freq: Every day | RESPIRATORY_TRACT | Status: DC

## 2024-02-08 NOTE — Assessment & Plan Note (Addendum)
 Active smoker -  Spirometry  03/12/14   FEV1 2.75 (94%)  Ratio 0.71 with minimal concavity to f/v loop       -  LDSCT: 12/01/23 Moderate centrilobular emphysema - 02/08/2024  After extensive coaching inhaler device,  effectiveness =    90% with DPI/ elipta so change advair/ spiriva  to Trelegy samples x 6 weeks - 02/08/2024   Walked on RA  x  3  lap(s) =  approx 750  ft  @ nl  pace, stopped due to end of study  with lowest 02 sats 93% and no sob   - PFT's ordered 02/08/2024 >>>   Group D (now reclassified as E) in terms of symptom/risk and laba/lama/ICS  therefore appropriate rx at this point >>>  trelegy 100 best option if insurance covers as likely to improve adherence while awaiting results of pfts   Re SABA :  I spent extra time with pt today reviewing appropriate use of albuterol  for prn use on exertion with the following points: 1) saba is for relief of sob that does not improve by walking a slower pace or resting but rather if the pt does not improve after trying this first. 2) If the pt is convinced, as many are, that saba helps recover from activity faster then it's easy to tell if this is the case by re-challenging : ie stop, take the inhaler, then p 5 minutes try the exact same activity (intensity of workload) that just caused the symptoms and see if they are substantially diminished or not after saba 3) if there is an activity that reproducibly causes the symptoms, try the saba 15 min before the activity on alternate days   If in fact the saba really does help, then fine to continue to use it prn but advised may need to look closer at the maintenance regimen being used to achieve better control of airways disease with exertion.   Each maintenance medication was reviewed in detail including emphasizing most importantly the difference between maintenance and prns and under what circumstances the prns are to be triggered using an action plan format where appropriate.  Total time for H and P, chart  review, counseling, reviewing hfa/ dpi  device(s) , directly observing portions of ambulatory 02 saturation study/ and generating customized AVS unique to this office visit / same day charting = 45 min new pt eval

## 2024-02-08 NOTE — Assessment & Plan Note (Signed)
 4-5 min discussion re active cigarette smoking in addition to office E&M  Ask about tobacco use:   ongoing Advise quitting   I took an extended  opportunity with this patient to outline the consequences of continued cigarette use  in airway disorders based on all the data we have from the multiple national lung health studies (perfomed over decades at millions of dollars in cost)  indicating that smoking cessation, not choice of inhalers or pulmonary physicians, is the most important aspect of his  care  = BREATHE CLEAN AIR Assess willingness:  does not appear committed at this point Assist in quit attempt:  Per PCP when ready  Agree with continuing LDSCT program until 10 y p he quits or age 60, whichever comes first

## 2024-02-08 NOTE — Patient Instructions (Addendum)
 Plan A = Automatic = Always=    stop advair and spiriva  and just take Trelegy 100 one click 1st thing  each am   Plan B = Backup (to supplement plan A, not to replace it) Only use your albuterol  inhaler as a rescue medication to be used if you can't catch your breath by resting or doing a relaxed purse lip breathing pattern.  - The less you use it, the better it will work when you need it. - Ok to use the inhaler up to 2 puffs  every 4 hours if you must but call for appointment if use goes up over your usual need - Don't leave home without it !!  (think of it like starter fluid) for your car)   The key is to stop smoking completely before smoking completely stops you!    Please schedule a follow up office visit in 6 weeks, call sooner if needed with all medications /inhalers/ solutions in hand so we can verify exactly what you are taking. This includes all medications from all doctors and over the counters       PFTs on return

## 2024-03-20 ENCOUNTER — Other Ambulatory Visit: Payer: Self-pay | Admitting: Internal Medicine

## 2024-03-20 MED ORDER — TRELEGY ELLIPTA 100-62.5-25 MCG/ACT IN AEPB: 1.0000 | INHALATION_SPRAY | Freq: Every day | RESPIRATORY_TRACT | Status: DC

## 2024-03-20 NOTE — Telephone Encounter (Signed)
 Copied from CRM #[id number]. Topic: Clinical - Medication Refill >> Mar 20, 2024  9:11 AM Leila BROCKS wrote: Most Recent Pulmonary Care Visit:  Provider: Dr. Ozell America  Department: [facility] Date: 02/08/24 Medication(s): Fluticasone -Umeclidin-Vilant (TRELEGY ELLIPTA ) 100-62.5-25 MCG/ACT AEPB   Has the patient contacted their pharmacy? No (Agent: If no, request that the patient contact the pharmacy for the refill. If patient does not wish to contact the pharmacy document the reason why and proceed with request.) (Agent: If yes, when and what did the pharmacy advise?)  This is the patient's preferred pharmacy:  [facility] [address] [place], [place] - [address] [place] [address] Phone: [phone] Fax: [phone]  Is this the correct pharmacy for this prescription? Yes If no, delete pharmacy and type the correct one.   Has the prescription been filled recently? No  Is the patient out of the medication? Yes. Patient [id number] states need a refill on Trelegy, toke the last medication this morning. Patient is insisting to have his medication refilled today.   Has the patient been seen for an appointment in the last year OR does the patient have an upcoming appointment? Yes, 04/06/24  Can we respond through MyChart? Yes  Agent: Please be advised that Rx refills may take up to 3 business days. We ask that you follow-up with your pharmacy.

## 2024-03-27 ENCOUNTER — Telehealth: Payer: Self-pay | Admitting: Internal Medicine

## 2024-03-27 ENCOUNTER — Telehealth: Payer: Self-pay | Admitting: *Deleted

## 2024-03-27 MED ORDER — TRELEGY ELLIPTA 100-62.5-25 MCG/ACT IN AEPB: 1.0000 | INHALATION_SPRAY | Freq: Every day | RESPIRATORY_TRACT | 5 refills | Status: AC

## 2024-03-27 NOTE — Telephone Encounter (Signed)
 Duplicate encounter

## 2024-03-27 NOTE — Telephone Encounter (Signed)
 PT upset Trellegy not filled. Tsf to [place].

## 2024-03-27 NOTE — Telephone Encounter (Signed)
 Received incoming call from the pt. Rx was never sent to pharmacy.  I have sent Trelegy inhaler to pt preferred pharmacy.  8/18 was a sample med, not a prescription.  Nothing further needed.

## 2024-03-27 NOTE — Addendum Note (Signed)
 Addended byBETHA FRIES, Latavius Capizzi A on: 03/27/2024 02:23 PM   Modules accepted: Orders

## 2024-03-28 NOTE — Telephone Encounter (Signed)
 Copied from CRM #[id number]. Topic: Clinical - Prescription Issue >> Mar 27, 2024  2:08 PM Farrel B wrote: Reason for CRM: [id number] called in regard to a medication refill for the pulmonology clinic for the provider Dr. Ozell America [phone], patient was extremely upset because I advised him that this was primary care and I could send a message over to the pulmonology clinic, but I wouldn't be able to assist him with a pulmonology call. Patient stated his medication had not been refilled after requesting it on the 18th of August  This has been handled.

## 2024-03-28 NOTE — Telephone Encounter (Signed)
 This has been handled. See 8/18 encounter.
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
## 2024-04-06 ENCOUNTER — Ambulatory Visit: Admitting: Internal Medicine

## 2024-04-06 ENCOUNTER — Encounter: Payer: Self-pay | Admitting: Internal Medicine

## 2024-04-06 VITALS — BP 121/73 | HR 71 | Temp 98.4°F | Ht 68.0 in | Wt 163.0 lb

## 2024-04-06 DIAGNOSIS — J432 Centrilobular emphysema: Secondary | ICD-10-CM | POA: Diagnosis not present

## 2024-04-06 DIAGNOSIS — F17210 Nicotine dependence, cigarettes, uncomplicated: Secondary | ICD-10-CM | POA: Diagnosis not present

## 2024-04-06 DIAGNOSIS — J449 Chronic obstructive pulmonary disease, unspecified: Secondary | ICD-10-CM

## 2024-04-06 NOTE — Patient Instructions (Addendum)
 No change in medications  The key is to stop smoking completely before smoking completely stops you!     Please schedule a follow up visit in 3 months but call sooner if needed with PFT

## 2024-04-06 NOTE — Assessment & Plan Note (Addendum)
 Counseled re importance of smoking cessation but did not meet time criteria for separate billing     Each maintenance medication was reviewed in detail including emphasizing most importantly the difference between maintenance and prns and under what circumstances the prns are to be triggered using an action plan format where appropriate.  Total time for H and P, chart review, counseling, reviewing DPI / elipta device(s) and generating customized AVS unique to this office visit / same day charting = 25 min

## 2024-04-06 NOTE — Assessment & Plan Note (Addendum)
 Active smoker -  Spirometry  03/12/14   FEV1 2.75 (94%)  Ratio 0.71 with minimal concavity to f/v loop       -  LDSCT: 12/01/23 Moderate centrilobular emphysema - 02/08/2024  After extensive coaching inhaler device,  effectiveness =    90% with DPI/ elipta so change advair/ spiriva  to Trelegy samples x 6 weeks - 02/08/2024   Walked on RA  x  3  lap(s) =  approx 750  ft  @ nl  pace, stopped due to end of study  with lowest 02 sats 93% and no sob   - PFT's ordered 02/08/2024 >>>   Group D (now reclassified as E) in terms of symptom/risk and laba/lama/ICS  therefore appropriate rx at this point >>>  trelegy 100 and approp saba / coupons available to defray the cost.   F/u q 3 m with pfts on return

## 2024-04-15 ENCOUNTER — Other Ambulatory Visit: Payer: Self-pay | Admitting: Internal Medicine

## 2024-04-15 DIAGNOSIS — Z720 Tobacco use: Secondary | ICD-10-CM

## 2024-04-17 NOTE — Telephone Encounter (Signed)
 Requested Prescriptions  Pending Prescriptions Disp Refills   buPROPion  ER (WELLBUTRIN  SR) 100 MG 12 hr tablet [Pharmacy Med Name: BUPROPION  ER / SR 100MG  TAB] 180 tablet 0    Sig: TAKE 1  BY MOUTH TWICE DAILY     Psychiatry: Antidepressants - bupropion  Failed - 04/17/2024  2:19 PM      Failed - Cr in normal range and within 360 days    Creat  Date Value Ref Range Status  10/31/2014 0.85 0.50 - 1.35 mg/dL Final   Creatinine, Ser  Date Value Ref Range Status  01/05/2023 1.15 0.76 - 1.27 mg/dL Final   Creatinine,U  Date Value Ref Range Status  07/03/2014 36.77 mg/dL Final    Comment:       Cutoff Values for Urine Drug Screen, Pain Mgmt          Drug Class           Cutoff (ng/mL)          Amphetamines             500          Barbiturates             200          Cocaine Metabolites      150          Benzodiazepines          200          Methadone                300          Opiates                  300          Phencyclidine             25          Propoxyphene             300          Marijuana Metabolites     50    For medical purposes only.          Failed - AST in normal range and within 360 days    AST  Date Value Ref Range Status  01/05/2023 9 0 - 40 IU/L Final         Failed - ALT in normal range and within 360 days    ALT  Date Value Ref Range Status  01/05/2023 11 0 - 44 IU/L Final         Passed - Last BP in normal range    BP Readings from Last 1 Encounters:  04/06/24 121/73         Passed - Valid encounter within last 6 months    Recent Outpatient Visits           5 months ago Annual physical exam   [facility] [place]. [facility] Barnie NOVAK, MD   11 months ago Essential hypertension   [facility] [place]. [facility] Barnie NOVAK, MD   1 year ago Essential hypertension   [facility] [place]. [facility] Barnie NOVAK, MD   2 years ago Essential hypertension   [facility] [place].  [facility] Barnie NOVAK, MD   3 years ago Essential hypertension   [facility] [place]. [facility] Barnie NOVAK, MD

## 2024-06-11 ENCOUNTER — Encounter (HOSPITAL_COMMUNITY): Payer: Self-pay

## 2024-06-11 ENCOUNTER — Ambulatory Visit (HOSPITAL_COMMUNITY)
Admission: EM | Admit: 2024-06-11 | Discharge: 2024-06-11 | Disposition: A | Discharge status: Home / Self Care | Attending: Internal Medicine | Admitting: Internal Medicine

## 2024-06-11 DIAGNOSIS — Z113 Encounter for screening for infections with a predominantly sexual mode of transmission: Secondary | ICD-10-CM | POA: Diagnosis present

## 2024-06-11 LAB — HIV ANTIBODY (ROUTINE TESTING W REFLEX): HIV Screen 4th Generation wRfx: NONREACTIVE

## 2024-06-11 NOTE — ED Triage Notes (Signed)
 Pt states that he was exposed to BV. Pt denies any symptoms at the time.
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
## 2024-06-11 NOTE — Discharge Instructions (Addendum)
 Screening swab and blood work done today and results will be available in 24-48 hours. We will contact you if we need to arrange additional treatment based on your testing. Negative results will be on your MyChart account. Abstain from sex until you receive your final results.  Use a condom for sexual encounters. If you have any worsening or changing symptoms including abnormal discharge, abdominal pain, fever, nausea, or vomiting, then you should be reevaluated.

## 2024-06-12 LAB — CYTOLOGY, (ORAL, ANAL, URETHRAL) ANCILLARY ONLY
Chlamydia: NEGATIVE
Comment: NEGATIVE
Comment: NEGATIVE
Comment: NORMAL
Neisseria Gonorrhea: NEGATIVE
Trichomonas: NEGATIVE

## 2024-06-12 LAB — RPR: RPR Ser Ql: NONREACTIVE

## 2024-06-20 ENCOUNTER — Other Ambulatory Visit: Payer: Self-pay | Admitting: Internal Medicine

## 2024-06-20 DIAGNOSIS — I10 Essential (primary) hypertension: Secondary | ICD-10-CM

## 2024-06-20 DIAGNOSIS — E785 Hyperlipidemia, unspecified: Secondary | ICD-10-CM

## 2024-07-11 ENCOUNTER — Other Ambulatory Visit: Payer: Self-pay | Admitting: Internal Medicine

## 2024-07-11 DIAGNOSIS — Z720 Tobacco use: Secondary | ICD-10-CM

## 2024-07-19 ENCOUNTER — Other Ambulatory Visit: Payer: Self-pay | Admitting: Internal Medicine

## 2024-07-19 DIAGNOSIS — E785 Hyperlipidemia, unspecified: Secondary | ICD-10-CM

## 2024-07-19 DIAGNOSIS — I10 Essential (primary) hypertension: Secondary | ICD-10-CM

## 2024-07-24 ENCOUNTER — Ambulatory Visit

## 2024-07-24 ENCOUNTER — Ambulatory Visit: Admitting: Internal Medicine

## 2024-07-24 ENCOUNTER — Encounter: Payer: Self-pay | Admitting: Internal Medicine

## 2024-07-24 VITALS — BP 98/60 | HR 103 | Temp 99.1°F | Ht 68.0 in | Wt 161.0 lb

## 2024-07-24 DIAGNOSIS — J432 Centrilobular emphysema: Secondary | ICD-10-CM | POA: Diagnosis not present

## 2024-07-24 DIAGNOSIS — J449 Chronic obstructive pulmonary disease, unspecified: Secondary | ICD-10-CM

## 2024-07-24 DIAGNOSIS — F17210 Nicotine dependence, cigarettes, uncomplicated: Secondary | ICD-10-CM | POA: Diagnosis not present

## 2024-07-24 LAB — PULMONARY FUNCTION TEST
DL/VA % pred: 61 %
DL/VA: 2.66 ml/min/mmHg/L
DLCO cor % pred: 47 %
DLCO cor: 12.57 ml/min/mmHg
DLCO unc % pred: 47 %
DLCO unc: 12.57 ml/min/mmHg
FEF 25-75 Post: 0.53 L/s
FEF 25-75 Pre: 0.67 L/s
FEF2575-%Change-Post: -20 %
FEF2575-%Pred-Post: 18 %
FEF2575-%Pred-Pre: 23 %
FEV1-%Change-Post: -6 %
FEV1-%Pred-Post: 40 %
FEV1-%Pred-Pre: 43 %
FEV1-Post: 1.38 L
FEV1-Pre: 1.48 L
FEV1FVC-%Change-Post: -4 %
FEV1FVC-%Pred-Pre: 64 %
FEV6-%Change-Post: -6 %
FEV6-%Pred-Post: 65 %
FEV6-%Pred-Pre: 70 %
FEV6-Post: 2.83 L
FEV6-Pre: 3.02 L
FEV6FVC-%Change-Post: -3 %
FEV6FVC-%Pred-Post: 101 %
FEV6FVC-%Pred-Pre: 104 %
FVC-%Change-Post: -3 %
FVC-%Pred-Post: 65 %
FVC-%Pred-Pre: 67 %
FVC-Post: 2.93 L
FVC-Pre: 3.02 L
Post FEV1/FVC ratio: 47 %
Post FEV6/FVC ratio: 97 %
Pre FEV1/FVC ratio: 49 %
Pre FEV6/FVC Ratio: 100 %
RV % pred: 180 %
RV: 3.78 L
TLC % pred: 99 %
TLC: 6.55 L
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
## 2024-07-24 NOTE — Patient Instructions (Signed)
 Full PFT performed today.

## 2024-07-24 NOTE — Assessment & Plan Note (Addendum)
 Counseled re importance of smoking cessation (reviewed drop in FEV1 from 8/10/215 attributable to active smoking  )  but did not meet time criteria for separate billing    >>>   F/u can be  6 months (to follow up on LDSCT due q April)         Each maintenance medication was reviewed in detail including emphasizing most importantly the difference between maintenance and prns and under what circumstances the prns are to be triggered using an action plan format where appropriate.  Total time for H and P, chart review, counseling, reviewing DPI/elipta and hfa device(s) and generating customized AVS unique to this office visit / same day charting = 31 min summary f/u ov

## 2024-07-24 NOTE — Assessment & Plan Note (Addendum)
 Active smoker with GOLD 3/ group E COPD  -  Spirometry  03/12/14   FEV1 2.75 (94%)  Ratio 0.71 with minimal concavity to f/v loop       -  LDSCT: 12/01/23 Moderate centrilobular emphysema - 02/08/2024  After extensive coaching inhaler device,  effectiveness =    90% with DPI/ elipta so change advair/ spiriva  to Trelegy samples x 6 weeks - 02/08/2024   Walked on RA  x  3  lap(s) =  approx 750  ft  @ nl  pace, stopped due to end of study  with lowest 02 sats 93% and no sob    -PFT's  07/24/2024   FEV1 1.48 (43 % ) ratio 0.49  p 0 % improvement from saba p trelegy  prior to study with DLCO  12.6 (47%)   and FV curve classically concave     Group E in terms of symptoms/risk so  laba/lama/ICS  therefore appropriate rx at this point >>>  trelegy 100   and approp SABA prn.

## 2024-07-24 NOTE — Patient Instructions (Signed)
The key is to stop smoking completely before smoking completely stops you!   Please schedule a follow up visit in 6 months but call sooner if needed    

## 2024-07-28 ENCOUNTER — Ambulatory Visit: Payer: Self-pay | Admitting: Internal Medicine

## 2024-11-13 ENCOUNTER — Ambulatory Visit: Admitting: Internal Medicine

## 2025-01-01 ENCOUNTER — Ambulatory Visit: Admitting: Internal Medicine
